# Patient Record
Sex: Male | Born: 1955 | Race: Black or African American | Hispanic: No | Marital: Married | Smoking: Current every day smoker
Health system: Southern US, Community
[De-identification: ages and names within clinical notes are randomized; demographics above are authoritative.]

## PROBLEM LIST (undated history)

## (undated) DIAGNOSIS — I10 Essential (primary) hypertension: Secondary | ICD-10-CM

## (undated) DIAGNOSIS — E785 Hyperlipidemia, unspecified: Secondary | ICD-10-CM

## (undated) DIAGNOSIS — R0602 Shortness of breath: Secondary | ICD-10-CM

## (undated) HISTORY — DX: Hyperlipidemia, unspecified: E78.5

## (undated) HISTORY — DX: Shortness of breath: R06.02

## (undated) HISTORY — DX: Essential (primary) hypertension: I10

---

## 1997-09-24 ENCOUNTER — Emergency Department (HOSPITAL_COMMUNITY): Admission: EM | Admit: 1997-09-24 | Discharge: 1997-09-24 | Payer: Self-pay | Admitting: Emergency Medicine

## 2005-12-31 ENCOUNTER — Ambulatory Visit: Payer: Self-pay | Admitting: Family Medicine

## 2006-01-21 ENCOUNTER — Ambulatory Visit: Payer: Self-pay | Admitting: Family Medicine

## 2006-01-21 LAB — CONVERTED CEMR LAB: Glucose, Bld: 132 mg/dL — ABNORMAL HIGH (ref 70–99)

## 2006-02-12 ENCOUNTER — Ambulatory Visit: Payer: Self-pay | Admitting: Family Medicine

## 2006-02-12 LAB — CONVERTED CEMR LAB
ALT: 21 units/L (ref 0–40)
AST: 27 units/L (ref 0–37)
BUN: 10 mg/dL (ref 6–23)
Glomerular Filtration Rate, Af Am: 102 mL/min/{1.73_m2}
Potassium: 3.8 meq/L (ref 3.5–5.1)

## 2006-04-06 ENCOUNTER — Ambulatory Visit: Payer: Self-pay | Admitting: Family Medicine

## 2006-04-06 LAB — CONVERTED CEMR LAB
ALT: 28 units/L (ref 0–40)
AST: 23 units/L (ref 0–37)
BUN: 11 mg/dL (ref 6–23)
CO2: 26 meq/L (ref 19–32)
Calcium: 9.1 mg/dL (ref 8.4–10.5)
Chloride: 105 meq/L (ref 96–112)
Creatinine, Ser: 1.1 mg/dL (ref 0.4–1.5)
Microalb Creat Ratio: 3 mg/g (ref 0.0–30.0)
Microalb, Ur: 0.6 mg/dL (ref 0.0–1.9)

## 2006-07-02 DIAGNOSIS — E785 Hyperlipidemia, unspecified: Secondary | ICD-10-CM | POA: Insufficient documentation

## 2006-07-02 DIAGNOSIS — E118 Type 2 diabetes mellitus with unspecified complications: Secondary | ICD-10-CM | POA: Insufficient documentation

## 2006-07-02 DIAGNOSIS — E119 Type 2 diabetes mellitus without complications: Secondary | ICD-10-CM

## 2006-07-06 ENCOUNTER — Ambulatory Visit: Payer: Self-pay | Admitting: Family Medicine

## 2006-07-06 LAB — CONVERTED CEMR LAB
ALT: 33 units/L (ref 0–40)
AST: 21 units/L (ref 0–37)
BUN: 11 mg/dL (ref 6–23)
CO2: 30 meq/L (ref 19–32)
Calcium: 9.1 mg/dL (ref 8.4–10.5)
Creatinine, Ser: 0.9 mg/dL (ref 0.4–1.5)
GFR calc Af Amer: 114 mL/min
Total CHOL/HDL Ratio: 3.9

## 2006-08-09 ENCOUNTER — Ambulatory Visit: Payer: Self-pay | Admitting: Family Medicine

## 2006-08-09 ENCOUNTER — Encounter: Payer: Self-pay | Admitting: Family Medicine

## 2006-08-09 ENCOUNTER — Encounter (INDEPENDENT_AMBULATORY_CARE_PROVIDER_SITE_OTHER): Payer: Self-pay | Admitting: Family Medicine

## 2006-08-09 DIAGNOSIS — I10 Essential (primary) hypertension: Secondary | ICD-10-CM

## 2006-08-09 LAB — CONVERTED CEMR LAB
Chloride: 111 meq/L (ref 96–112)
GFR calc non Af Amer: 108 mL/min
Potassium: 3.7 meq/L (ref 3.5–5.1)
Sodium: 143 meq/L (ref 135–145)

## 2006-08-10 ENCOUNTER — Telehealth (INDEPENDENT_AMBULATORY_CARE_PROVIDER_SITE_OTHER): Payer: Self-pay | Admitting: *Deleted

## 2006-09-01 ENCOUNTER — Ambulatory Visit: Payer: Self-pay | Admitting: Internal Medicine

## 2006-09-01 ENCOUNTER — Encounter (INDEPENDENT_AMBULATORY_CARE_PROVIDER_SITE_OTHER): Payer: Self-pay | Admitting: Family Medicine

## 2006-09-01 ENCOUNTER — Telehealth (INDEPENDENT_AMBULATORY_CARE_PROVIDER_SITE_OTHER): Payer: Self-pay | Admitting: *Deleted

## 2006-09-01 LAB — CONVERTED CEMR LAB
BUN: 14 mg/dL (ref 6–23)
Creatinine, Ser: 1 mg/dL (ref 0.4–1.5)
GFR calc Af Amer: 101 mL/min
GFR calc non Af Amer: 84 mL/min
Potassium: 3.9 meq/L (ref 3.5–5.1)

## 2006-11-17 ENCOUNTER — Ambulatory Visit: Payer: Self-pay | Admitting: Family Medicine

## 2006-11-18 ENCOUNTER — Telehealth (INDEPENDENT_AMBULATORY_CARE_PROVIDER_SITE_OTHER): Payer: Self-pay | Admitting: *Deleted

## 2006-11-18 LAB — CONVERTED CEMR LAB
ALT: 23 U/L
AST: 22 U/L
Cholesterol: 129 mg/dL
Glucose, Bld: 86 mg/dL
HDL: 48.1 mg/dL
Hgb A1c MFr Bld: 6.5 % — ABNORMAL HIGH
LDL Cholesterol: 57 mg/dL
Total CHOL/HDL Ratio: 2.7
Triglycerides: 122 mg/dL
VLDL: 24 mg/dL

## 2007-02-17 ENCOUNTER — Ambulatory Visit: Payer: Self-pay | Admitting: Family Medicine

## 2007-02-23 LAB — CONVERTED CEMR LAB
BUN: 12 mg/dL (ref 6–23)
Calcium: 9.3 mg/dL (ref 8.4–10.5)
Chloride: 108 meq/L (ref 96–112)
GFR calc non Af Amer: 95 mL/min
Hgb A1c MFr Bld: 6.8 % — ABNORMAL HIGH (ref 4.6–6.0)

## 2007-04-12 ENCOUNTER — Telehealth (INDEPENDENT_AMBULATORY_CARE_PROVIDER_SITE_OTHER): Payer: Self-pay | Admitting: *Deleted

## 2007-11-21 ENCOUNTER — Telehealth (INDEPENDENT_AMBULATORY_CARE_PROVIDER_SITE_OTHER): Payer: Self-pay | Admitting: *Deleted

## 2007-11-22 ENCOUNTER — Ambulatory Visit: Payer: Self-pay | Admitting: Internal Medicine

## 2007-11-22 DIAGNOSIS — F17200 Nicotine dependence, unspecified, uncomplicated: Secondary | ICD-10-CM

## 2007-11-23 ENCOUNTER — Ambulatory Visit: Payer: Self-pay | Admitting: Internal Medicine

## 2007-11-28 LAB — CONVERTED CEMR LAB
AST: 22 units/L (ref 0–37)
HDL: 47.8 mg/dL (ref >39.0)
Hgb A1c MFr Bld: 6.9 % — ABNORMAL HIGH (ref 4.6–6.0)
Microalb Creat Ratio: 5.7 mg/g (ref 0.0–30.0)
Triglycerides: 102 mg/dL (ref 0–149)

## 2008-12-04 ENCOUNTER — Telehealth (INDEPENDENT_AMBULATORY_CARE_PROVIDER_SITE_OTHER): Payer: Self-pay | Admitting: *Deleted

## 2009-04-19 ENCOUNTER — Telehealth: Payer: Self-pay | Admitting: Internal Medicine

## 2009-07-29 ENCOUNTER — Telehealth (INDEPENDENT_AMBULATORY_CARE_PROVIDER_SITE_OTHER): Payer: Self-pay | Admitting: *Deleted

## 2009-08-30 ENCOUNTER — Ambulatory Visit: Payer: Self-pay | Admitting: Internal Medicine

## 2009-09-09 ENCOUNTER — Telehealth: Payer: Self-pay | Admitting: Internal Medicine

## 2009-09-10 ENCOUNTER — Telehealth (INDEPENDENT_AMBULATORY_CARE_PROVIDER_SITE_OTHER): Payer: Self-pay | Admitting: *Deleted

## 2009-09-10 LAB — CONVERTED CEMR LAB
ALT: 16 units/L (ref 0–53)
Basophils Relative: 1 % (ref 0–1)
CO2: 25 meq/L (ref 19–32)
Calcium: 9 mg/dL (ref 8.4–10.5)
Creatinine, Ser: 0.92 mg/dL (ref 0.40–1.50)
Hgb A1c MFr Bld: 7 % — ABNORMAL HIGH (ref <5.7)
Lymphs Abs: 2.5 10*3/uL (ref 0.7–4.0)
MCHC: 31.9 g/dL (ref 30.0–36.0)
Monocytes Relative: 12 % (ref 3–12)
Neutro Abs: 5.5 10*3/uL (ref 1.7–7.7)
Neutrophils Relative %: 59 % (ref 43–77)
Platelets: 232 10*3/uL (ref 150–400)
RBC: 6.04 M/uL — ABNORMAL HIGH (ref 4.22–5.81)
Sodium: 141 meq/L (ref 135–145)
TSH: 2.036 microintl units/mL (ref 0.350–4.500)
WBC: 9.2 10*3/uL (ref 4.0–10.5)

## 2010-01-21 ENCOUNTER — Telehealth (INDEPENDENT_AMBULATORY_CARE_PROVIDER_SITE_OTHER): Payer: Self-pay | Admitting: *Deleted

## 2010-01-31 ENCOUNTER — Encounter (INDEPENDENT_AMBULATORY_CARE_PROVIDER_SITE_OTHER): Payer: Self-pay | Admitting: *Deleted

## 2010-02-24 ENCOUNTER — Ambulatory Visit: Payer: Self-pay | Admitting: Internal Medicine

## 2010-02-24 DIAGNOSIS — D649 Anemia, unspecified: Secondary | ICD-10-CM

## 2010-02-28 ENCOUNTER — Ambulatory Visit: Payer: Self-pay | Admitting: Internal Medicine

## 2010-03-03 LAB — CONVERTED CEMR LAB
Ferritin: 153.2 ng/mL (ref 22.0–322.0)
Hemoglobin: 13.2 g/dL (ref 13.0–17.0)
Hgb A1c MFr Bld: 7.2 % — ABNORMAL HIGH (ref 4.6–6.5)
Iron: 43 ug/dL (ref 42–165)
LDL Cholesterol: 84 mg/dL (ref 0–99)
Total CHOL/HDL Ratio: 3
Triglycerides: 80 mg/dL (ref 0.0–149.0)

## 2010-03-07 ENCOUNTER — Encounter (INDEPENDENT_AMBULATORY_CARE_PROVIDER_SITE_OTHER): Payer: Self-pay | Admitting: *Deleted

## 2010-04-29 NOTE — Progress Notes (Signed)
Summary: no RF w/o OV  Phone Note Outgoing Call Call back at [facility] Phone [phone] Call back at Work Phone [phone]   Summary of Call: LAST OV 2009, NEEDS TO SCHEDULE OV BEFORE I CAN REFILL MED Shary Decamp  Jul 29, 2009 2:10 PM   Follow-up for Phone Call        Patient stated that work is very slow right now and he can not afford to come in. I offered him billings number to set up a payment plan and he refused because he said he was to far behind on his bills already. I said he can barely afford his medications as it is.  Follow-up by: Harold Barban,  Jul 30, 2009 9:42 AM  Additional Follow-up for Phone Call Additional follow up Details #1::        Dr Drue Novel see phone note from 03/2009.  Do you want to keep rxing meds?? Shary Decamp  Jul 30, 2009 10:54 AM     Additional Follow-up for Phone Call Additional follow up Details #2::    I ike to help but i can harm him by Rx meds w/o supervision. No RF w/o OV. Give patient the # of  [facility] [place] [facility]. Paz MD  Jul 30, 2009 1:47 PM   Pt has been informed of Dr Drue Novel recommendations, [facility] numbers were  given for Marsa Aris  and [facility] .Kandice Hams  Jul 30, 2009 2:17 PM  Follow-up by: Kandice Hams,  Jul 30, 2009 2:17 PM

## 2010-04-29 NOTE — Progress Notes (Signed)
Summary: FYI-Lab Work, unable to add iron d/t cost  Phone Note Outgoing Call   Call placed by: Harold Barban,  September 09, 2009 10:33 AM Call placed to: Patient Summary of Call: Spoke with patient about coming in to draw a iron-ferritin per your request and the patient says he cannot afford to do that right now. He has no insurance and is paying out of pocket. Initial call taken by: Harold Barban,  September 09, 2009 10:34 AM  Follow-up for Phone Call        noted [place] E. Dianna Ewald MD  September 09, 2009 12:39 PM

## 2010-04-29 NOTE — Assessment & Plan Note (Signed)
Summary: cpx/cbs   Vital Signs:  Patient profile:   55 year old male Height:      66 inches Weight:      178 pounds BMI:     28.83 Temp:     98.3 degrees F oral Pulse rate:   82 / minute Resp:     18 per minute BP sitting:   140 / 78  (left arm)  Vitals Entered By: Jeremy Johann CMA (August 30, 2009 2:41 PM) CC: cpx Comments --not fasting --refills REVIEWED MED LIST, PATIENT AGREED DOSE AND INSTRUCTION CORRECT    History of Present Illness: CPX   Diabetes-- good medication compliance  Hyperlipidemia-- good medication compliance  HTN-- run out of meds , no ambulatory BPs   Allergies (verified): No Known Drug Allergies  Past History:  Past Medical History: Reviewed history from 11/22/2007 and no changes required. Diabetes mellitus, type II Hyperlipidemia HTN  Past Surgical History: no major surgery   Family History: Reviewed history from 11/22/2007 and no changes required. CAD - no HTN - M DM - M stroke - no colon Ca - no prostate Ca - no alzheimer's - M  father's family hx - unknown  Social History: Occupation: Recruitment consultant Married with 2 children Current Smoker, 1 ppd  Alcohol use-no Drug use-no diet-- try to eat well but still eats carbs-sweats exercise-- active at work  Review of Systems General:  Denies fever and weight loss. CV:  Denies chest pain or discomfort; occ LE edema whenever he eats beef or dairy products. Resp:  Denies cough, shortness of breath, and wheezing. GI:  Denies bloody stools, diarrhea, nausea, and vomiting. GU:  Denies hematuria, urinary frequency, and urinary hesitancy. Endo:  no ambulatory CBGs .  Physical Exam  General:  alert, well-developed, and well-nourished.   Neck:  no masses and no thyromegaly.   Lungs:  normal respiratory effort, no intercostal retractions, no accessory muscle use, and normal breath sounds.   Heart:  normal rate, regular rhythm, no murmur, and no gallop.   Abdomen:  soft, non-tender,  no distention, no masses, no guarding, and no rigidity.   Rectal:  No external abnormalities noted. Normal sphincter tone. No rectal masses or tenderness. Hemoccult negative Prostate:  Prostate gland firm and smooth, no enlargement, nodularity, tenderness, mass, asymmetry or induration. Extremities:  no lower extremity edema  Diabetes Management Exam:    Foot Exam (with socks and/or shoes not present):       Sensory-Pinprick/Light touch:          Left medial foot (L-4): normal          Left dorsal foot (L-5): normal          Left lateral foot (S-1): normal          Right medial foot (L-4): normal          Right dorsal foot (L-5): normal          Right lateral foot (S-1): normal       Sensory-Monofilament:          Left foot: normal          Right foot: normal       Inspection:          Left foot: normal          Right foot: normal       Nails:          Left foot: normal  Right foot: normal   Impression & Recommendations:  Problem # 1:  ROUTINE GENERAL MEDICAL EXAM@HEALTH  CARE FACL (ICD-V70.0)  Td 07 Discussed diet and exercise Counseled  about tobacco, aware of  "quitting programs"  at the hospital labs  Colonoscopy Vs.iFOB cards reviewed w/ pt. Provided  iFOB but he will  call if he decides to have a  colonoscopy   Orders: Venipuncture ([address])  Problem # 2:  HYPERTENSION, BENIGN ESSENTIAL (ICD-401.1) runned out of meds x  3 days, BP okay labs, samples provided His updated medication list for this problem includes:    Benicar 20 Mg Tabs (Olmesartan medoxomil) .Marland Kitchen... 1 by mouth once daily - due office visit  BP today: 140/78 Prior BP: 148/80 (11/22/2007)  Labs Reviewed: K+: 3.8 (02/17/2007) Creat: : 0.9 (02/17/2007)   Chol: 138 (11/23/2007)   HDL: 47.8 (11/23/2007)   LDL: 70 (11/23/2007)   TG: 102 (11/23/2007)  Problem # 3:  HYPERLIPIDEMIA (ICD-272.4) check LFTs, not fasting, FLP on RTC  His updated medication list for this problem includes:    Zocor 40 Mg  Tabs (Simvastatin) .Marland Kitchen... Take 1 tablet by mouth once a day  Labs Reviewed: SGOT: 22 (11/23/2007)   SGPT: 32 (11/23/2007)   HDL:47.8 (11/23/2007), 48.1 (11/17/2006)  LDL:70 (11/23/2007), 57 (11/17/2006)  Chol:138 (11/23/2007), 129 (11/17/2006)  Trig:102 (11/23/2007), 122 (11/17/2006)  Problem # 4:  DIABETES MELLITUS, TYPE II (ICD-250.00) no ambulatory CBGs, labs recommend routine eye check ups His updated medication list for this problem includes:    Benicar 20 Mg Tabs (Olmesartan medoxomil) .Marland Kitchen... 1 by mouth once daily - due office visit    Metformin Hcl 1000 Mg Tabs (Metformin hcl) .Marland Kitchen... 2 by mouth once daily  Labs Reviewed: Creat: 0.9 (02/17/2007)    Reviewed HgBA1c results: 6.9 (11/23/2007)  6.8 (02/17/2007)  Complete Medication List: 1)  Zocor 40 Mg Tabs (Simvastatin) .... Take 1 tablet by mouth once a day 2)  Benicar 20 Mg Tabs (Olmesartan medoxomil) .Marland Kitchen.. 1 by mouth once daily - due office visit 3)  Metformin Hcl 1000 Mg Tabs (Metformin hcl) .... 2 by mouth once daily  Patient Instructions: 1)  Please schedule a follow-up appointment in 4 months .  Prescriptions: METFORMIN HCL 1000 MG  TABS (METFORMIN HCL) 2 by mouth once daily  #60 x 6   Entered and Authorized by:   Elita Quick E. Maydell Knoebel MD   Signed by:   Nolon Rod. Janey Petron MD on 08/30/2009   Method used:   Electronically to        [id number]* (retail)       [address] [place], [place]  [address]       Ph: [phone]       Fax: [phone]   RxID:   [id number] BENICAR 20 MG TABS (OLMESARTAN MEDOXOMIL) 1 by mouth once daily - DUE OFFICE VISIT  #30 x 6   Entered and Authorized by:   Nolon Rod. Hampton Cost MD   Signed by:   Nolon Rod. Caitlin Ainley MD on 08/30/2009   Method used:   Electronically to        [facility] #[id number]* (retail)       [address] [place], [place]  [address]       Ph: [phone]       Fax: [phone]   RxID:   [id number] ZOCOR 40 MG TABS (SIMVASTATIN) Take 1 tablet by mouth once  a  day  #30 x 6   Entered and Authorized by:   Nolon Rod. Aylssa Herrig MD   Signed by:   Nolon Rod. Addaline Peplinski MD on 08/30/2009   Method used:   Electronically to        [facility] #[id number]* (retail)       [address] [place], [place]  [address]       Ph: [phone]       Fax: [phone]   RxID:   [id number]

## 2010-04-29 NOTE — Letter (Signed)
Summary: Primary Care Appointment Letter  [facility] [place]  [address] [place], [place] [address]   Phone: [phone]  Fax: [phone]    01/31/2010 MRN: [id number]  Tristan Figueroa [address] [place], [place]  [address]  Dear Mr. [name],   Your Primary Care Physician [place] E. Paz MD has indicated that:    _XXX______it is time to schedule an appointment for "office visit and              labs"    _______you missed your appointment on______ and need to call and          reschedule.    _______you need to have lab work done.    _______you need to schedule an appointment discuss lab or test results.    _______you need to call to reschedule your appointment that is                       scheduled on _________.     Please call our office as soon as possible. Our phone number is [phone]. Our office is open 8a-12noon and 1p-5p, Monday through Friday.     Thank you,     Sarah at extension [phone]    [facility]

## 2010-04-29 NOTE — Progress Notes (Signed)
Summary: due  for office visit-labs  lm 10/25--mailed letter 11-04  Phone Note Outgoing Call   Summary of Call: please call or send a letter. He is due for a office visit and labs  [place] E. Paz MD  January 21, 2010 10:34 AM   Follow-up for Phone Call        Home number is d/c or not in service. Lm with Diplomatic Services operational officer at work to call back to office. Lucious Groves CMA  January 21, 2010 3:02 PM

## 2010-04-29 NOTE — Assessment & Plan Note (Signed)
Summary: rto 6 months.cbs   Vital Signs:  Patient profile:   55 year old male Weight:      177.38 pounds Pulse rate:   91 / minute Pulse rhythm:   regular BP sitting:   124 / 82  (left arm) Cuff size:   regular  Vitals Entered By: Army Fossa CMA (February 24, 2010 10:41 AM) CC: 6 month f/u- had a coffee w/ cream and sugar Comments States when eating dairy products feet swell. [facility] [address] Declines flu shot    History of Present Illness: routine office visit  Diabetes-- no ambulatory CBGs , diet ok , trying to eat healthy ; does  like rice  Hyperlipidemia-- good medication compliance  HTN-- no ambulatory BPs , good medication compliance  found to have mild anemia, we were  unable to add iron to his labs. IFOB not done   dairy products cause R ankle swelling    ROS denies pruritus , bloating, N-V-D w/ dairy products  no myalgias , no major aches and pains  no blood in stools no abd. pain    Current Medications (verified): 1)  Zocor 40 Mg Tabs (Simvastatin) .... Take 1 Tablet By Mouth Once A Day 2)  Benicar 20 Mg Tabs (Olmesartan Medoxomil) .Marland Kitchen.. 1 By Mouth Once Daily - Due Office Visit 3)  Metformin Hcl 1000 Mg  Tabs (Metformin Hcl) .... 2 By Mouth Once Daily  Allergies (verified): No Known Drug Allergies  Past History:  Past Medical History: Reviewed history from 11/22/2007 and no changes required. Diabetes mellitus, type II Hyperlipidemia HTN  Past Surgical History: Reviewed history from 08/30/2009 and no changes required. no major surgery   Physical Exam  General:  alert and well-developed.   Lungs:  normal respiratory effort, no intercostal retractions, no accessory muscle use, and normal breath sounds.   Heart:  normal rate, regular rhythm, and no murmur.   Extremities:  no lower extremity edema   Impression & Recommendations:  Problem # 1:  ANEMIA (ICD-285.9) last hemoglobin slightly low Never had a colonoscopy, DRE and hemocult (-)  6-11 cost of care is an  issue. plan: Labs If iron deficient, will need GI workup  Problem # 2:  ? of LACTOSE INTOLERANCE (ICD-271.3) right ankle edema with dairy products etiology unclear  Problem # 3:  HYPERTENSION, BENIGN ESSENTIAL (ICD-401.1) at goal  His updated medication list for this problem includes:    Benicar 20 Mg Tabs (Olmesartan medoxomil) .Marland Kitchen... 1 by mouth once daily - due office visit  BP today: 124/82 Prior BP: 140/78 (08/30/2009)  Labs Reviewed: K+: 3.8 (08/30/2009) Creat: : 0.92 (08/30/2009)   Chol: 138 (11/23/2007)   HDL: 47.8 (11/23/2007)   LDL: 70 (11/23/2007)   TG: 102 (11/23/2007)  Problem # 4:  HYPERLIPIDEMIA (ICD-272.4) due for labs His updated medication list for this problem includes:    Zocor 40 Mg Tabs (Simvastatin) .Marland Kitchen... Take 1 tablet by mouth once a day  Labs Reviewed: SGOT: 16 (08/30/2009)   SGPT: 16 (08/30/2009)   HDL:47.8 (11/23/2007), 48.1 (11/17/2006)  LDL:70 (11/23/2007), 57 (11/17/2006)  Chol:138 (11/23/2007), 129 (11/17/2006)  Trig:102 (11/23/2007), 122 (11/17/2006)  Problem # 5:  DIABETES MELLITUS, TYPE II (ICD-250.00) labs Encouraged a  healthy lifestyle His updated medication list for this problem includes:    Benicar 20 Mg Tabs (Olmesartan medoxomil) .Marland Kitchen... 1 by mouth once daily - due office visit    Metformin Hcl 1000 Mg Tabs (Metformin hcl) .Marland Kitchen... 2 by mouth once daily  Labs Reviewed: Creat:  0.92 (08/30/2009)    Reviewed HgBA1c results: 7.0 (08/30/2009)  6.9 (11/23/2007)  Complete Medication List: 1)  Zocor 40 Mg Tabs (Simvastatin) .... Take 1 tablet by mouth once a day 2)  Benicar 20 Mg Tabs (Olmesartan medoxomil) .Marland Kitchen.. 1 by mouth once daily - due office visit 3)  Metformin Hcl 1000 Mg Tabs (Metformin hcl) .... 2 by mouth once daily  Patient Instructions: 1)  come back fasting 2)  FLP ----dx hyperlipidemia 3)  Hemoglobin A1c----- dx  diabetes 4)  Hemoglobin, iron, ferritin---- dx  anemia 5)  Please schedule a follow-up  appointment in 6 months .    Orders Added: 1)  Est. Patient Level III [78469]   Immunization History:  Influenza Immunization History:    Influenza:  strongly declined  (02/24/2010)   Immunization History:  Influenza Immunization History:    Influenza:  strongly declined  (02/24/2010)

## 2010-04-29 NOTE — Progress Notes (Signed)
Summary: UNABLE TO REACH LABS MAILED  Phone Note Outgoing Call Call back at [phone]   Call placed by: [facility] CMA,  September 10, 2009 11:11 AM Details for Reason: advised patient DM remains well controlled He has mild anemia, will have to recheck when he comes back ( was unable to get his iron checked).  all other labs within normal  remind him to return in the iFOB ( stool test )  Summary of Call: left message to call office.................Marland KitchenFelecia Deloach CMA  September 10, 2009 11:11 AM  TRIED TO CALL PT # DISCONNECTED WILL TRY AGAIN LATER...........Marland KitchenFelecia Deloach CMA  September 11, 2009 10:24 AM  tried to call pt # still disconnect will try again tomorrow....................Marland KitchenFelecia Deloach CMA  September 12, 2009 2:36 PM  tried to call several times #still disconnected labs mailed to pt.................Marland KitchenFelecia Deloach CMA  September 13, 2009 11:25 AM

## 2010-04-29 NOTE — Progress Notes (Signed)
Summary: needs OV   Phone Note Call from Patient Call back at [phone]   Summary of Call: Patient has a cpx scheduled for 04/26/09.  Patient states that he is not going to be able to pay for his visit because he is currently not working & does not have insurance.  Patient wants to know if MD will refill meds without ov?  Last ov here was 10/2007 Shary Decamp  April 19, 2009 11:59 AM   Follow-up for Phone Call        unfortunately i won't  be able to prescribe his medications long-term. call a two-month supply of medicines samples of Benicar enough for two months recommended patient to get stablish @ [facility]  or the health department Follow-up by: Nolon Rod. Jayin Derousse MD,  April 19, 2009 2:58 PM  Additional Follow-up for Phone Call Additional follow up Details #1::        DISCUSSED WITH PT, SAMPLES GIVEN TO PT Additional Follow-up by: Shary Decamp,  April 22, 2009 11:57 AM    Prescriptions: METFORMIN HCL 1000 MG  TABS (METFORMIN HCL) 2 by mouth once daily  #60.0 Each x 1   Entered by:   Shary Decamp   Authorized by:   Nolon Rod. Gerldine Suleiman MD   Signed by:   Shary Decamp on 04/22/2009   Method used:   Electronically to        [id number]* (retail)       [address] [place], [place]  [address]       Ph: [phone]       Fax: [phone]   RxID:   [id number] ZOCOR 40 MG TABS (SIMVASTATIN) Take 1 tablet by mouth once a day  #30.0 Each x 1   Entered by:   Shary Decamp   Authorized by:   Nolon Rod. Mackie Holness MD   Signed by:   Shary Decamp on 04/22/2009   Method used:   Electronically to        [id number]* (retail)       [address] [place], [place]  [address]       Ph: [phone]       Fax: [phone]   RxID:   [id number]

## 2010-04-29 NOTE — Letter (Signed)
Summary: Unable To Reach-Consult Scheduled  [facility] [place]  [address] [place], [place] [address]   Phone: [phone]  Fax: [phone]    03/07/2010 MRN: [id number]    Dear [name],   We have been unable to reach you by phone.  Please contact our office with an updated phone number.    Thank you,  Army Fossa CMA  March 07, 2010 3:49 PM

## 2010-06-02 ENCOUNTER — Encounter (INDEPENDENT_AMBULATORY_CARE_PROVIDER_SITE_OTHER): Payer: Self-pay | Admitting: *Deleted

## 2010-06-02 ENCOUNTER — Telehealth (INDEPENDENT_AMBULATORY_CARE_PROVIDER_SITE_OTHER): Payer: Self-pay | Admitting: *Deleted

## 2010-06-10 NOTE — Letter (Signed)
Summary: Primary Care Appointment Letter  [facility] [place]  [address] [place], [place] [address]   Phone: [phone]  Fax: [phone]    06/02/2010 MRN: [id number]  Tristan Figueroa [address] [place], [place]  [address]  Dear Mr. [name],   Your Primary Care Physician [place] E. Paz MD has indicated that:    _______it is time to schedule an appointment.    _______you missed your appointment on______ and need to call and          reschedule.    ___X____you need to have lab work done.    _______you need to schedule an appointment discuss lab or test results.    _______you need to call to reschedule your appointment that is                       scheduled on _________.     Please call our office as soon as possible. Our phone number is [phone]. Please press option 1. Our office is open 8a-12noon and 1p-5p, Monday through Friday.     Thank you,    [facility]

## 2010-06-10 NOTE — Progress Notes (Signed)
----   Converted from flag ---- ---- 03/01/2010 11:57 AM, Elita Quick E. Paz MD wrote: needs A1c ------------------------------  number disconnected-- will mail pt a letter.

## 2010-10-23 ENCOUNTER — Other Ambulatory Visit: Payer: Self-pay | Admitting: Internal Medicine

## 2011-01-07 ENCOUNTER — Ambulatory Visit (INDEPENDENT_AMBULATORY_CARE_PROVIDER_SITE_OTHER): Payer: Self-pay | Admitting: Internal Medicine

## 2011-01-07 ENCOUNTER — Encounter: Payer: Self-pay | Admitting: Internal Medicine

## 2011-01-07 DIAGNOSIS — I10 Essential (primary) hypertension: Secondary | ICD-10-CM

## 2011-01-07 DIAGNOSIS — F17200 Nicotine dependence, unspecified, uncomplicated: Secondary | ICD-10-CM

## 2011-01-07 DIAGNOSIS — E119 Type 2 diabetes mellitus without complications: Secondary | ICD-10-CM

## 2011-01-07 DIAGNOSIS — Z136 Encounter for screening for cardiovascular disorders: Secondary | ICD-10-CM

## 2011-01-07 DIAGNOSIS — Z0000 Encounter for general adult medical examination without abnormal findings: Secondary | ICD-10-CM

## 2011-01-07 DIAGNOSIS — E785 Hyperlipidemia, unspecified: Secondary | ICD-10-CM

## 2011-01-07 LAB — TSH: TSH: 1.04 u[IU]/mL (ref 0.35–5.50)

## 2011-01-07 LAB — IRON: Iron: 42 ug/dL (ref 42–165)

## 2011-01-07 LAB — COMPREHENSIVE METABOLIC PANEL
ALT: 18 U/L (ref 0–53)
CO2: 27 mEq/L (ref 19–32)
Calcium: 9.1 mg/dL (ref 8.4–10.5)
Chloride: 107 mEq/L (ref 96–112)
GFR: 112.45 mL/min (ref >60.00)
Sodium: 141 mEq/L (ref 135–145)
Total Protein: 7.3 g/dL (ref 6.0–8.3)

## 2011-01-07 LAB — MICROALBUMIN / CREATININE URINE RATIO: Microalb Creat Ratio: 1.1 mg/g (ref 0.0–30.0)

## 2011-01-07 LAB — LIPID PANEL: Total CHOL/HDL Ratio: 4

## 2011-01-07 MED ORDER — LOSARTAN POTASSIUM 25 MG PO TABS: 25.0000 mg | ORAL_TABLET | Freq: Every day | ORAL | Status: DC

## 2011-01-07 NOTE — Assessment & Plan Note (Signed)
Risk reviewed, counseled about quitting

## 2011-01-07 NOTE — Assessment & Plan Note (Signed)
Based on the last hemoglobin A1c he was recommended amaryl  but never started. Labs Feet exam normal Eye care and feet are discussed.

## 2011-01-07 NOTE — Assessment & Plan Note (Addendum)
Td 07 ekg nsr  Declined a  flu shot, explained the benefits Discussed diet and exercise Counseled  about tobacco, aware of  "quitting programs"  at the hospital labs including a CBC, vitamin D and iron as his Hg was slt low in the past Colonoscopy Vs.iFOB cards reviewed w/ pt. Provided  iFOB but he will  call if he decides to have a  colonoscopy  Diet-exercise discussed   start ASA , benefits discussed

## 2011-01-07 NOTE — Assessment & Plan Note (Addendum)
Patient was supposed to be on Zocor 40 mg but self decreased to 20 mg because he felt that Zocor was affecting his immune  system, "I was having a lot of colds". I discussed the issue with the patient, I doubt frequent colds are related to Zocor. Plan: D/C Zocor Labs Lipitor?

## 2011-01-07 NOTE — Assessment & Plan Note (Addendum)
He was taking Benicar 20 mg but not the lower extremity edema, he self decrease Benicar to half tablet every of the day. BP okay today. I discussed the issue with the patient, doubt edema related to ARBs Plan:  D/C Benicar losartan, low dose mostly for kidney protection  Labs

## 2011-01-07 NOTE — Progress Notes (Signed)
  Subjective:    Patient ID: Tristan Figueroa, male    DOB: 06-05-1955, 55 y.o.   MRN: [id number]  HPI Complete physical exam, likes his vitamin D check, wonders if he can take fish oil (yes) Diabetes--base on  last hemoglobin A1c, he was recommended amaryl but never started. Hypertension--has decreased dose of Benicar d/t s/e (?) , see assessment and plan Hyperlipidemia--can't decrease Zocor to a half tablet daily d/t s/e (?), see assessment and plan  Past Medical History  Diagnosis Date  . Hypertension   . Hyperlipidemia   . Diabetes mellitus     type 2   Past Surgical History  Procedure Date  . No past surgeries    History   Social History  . Marital Status: Married    Spouse Name: N/A    Number of Children: 2  . Years of Education: N/A   Occupational History  . INSTALLER    Social History Main Topics  . Smoking status: Current Everyday Smoker -- 1 years    Types: Cigarettes  . Smokeless tobacco: Never Used  . Alcohol Use: No  . Drug Use: No  . Sexually Active: Not on file   Other Topics Concern  . Not on file   Social History Narrative   Active at work, no routine exercise ---diet: tries to eat well-healthy   Family History  Problem Relation Age of Onset  . Hypertension Mother   . Diabetes Mother   . Dementia Mother   . Coronary artery disease Neg Hx   . Colon cancer Neg Hx   . Prostate cancer Neg Hx      Review of Systems  Constitutional: Negative for fever and fatigue.  Respiratory: Negative for cough, shortness of breath and wheezing.   Cardiovascular: Negative for chest pain and palpitations.  Gastrointestinal: Negative for abdominal pain and blood in stool.  Genitourinary: Negative for dysuria, hematuria and difficulty urinating.  Psychiatric/Behavioral:       No anxiety-depression       Objective:   Physical Exam  Constitutional: He is oriented to person, place, and time. He appears well-developed and well-nourished. No distress.  HENT:    Head: Normocephalic and atraumatic.  Neck: No thyromegaly present.  Cardiovascular: Normal rate, regular rhythm and normal heart sounds.   No murmur heard. Pulmonary/Chest: Effort normal and breath sounds normal. No respiratory distress. He has no wheezes. He has no rales.  Abdominal: Soft. Bowel sounds are normal. He exhibits no distension. There is no tenderness. There is no rebound and no guarding.  Genitourinary: Rectum normal and prostate normal. Guaiac negative stool.  Musculoskeletal:       DIABETIC FEET EXAM: No lower extremity edema Normal pedal pulses bilaterally Skin and nails are normal without calluses Pinprick examination of the feet normal.   Neurological: He is alert and oriented to person, place, and time.  Skin: Skin is warm and dry. He is not diaphoretic.  Psychiatric: He has a normal mood and affect. His behavior is normal. Judgment and thought content normal.          Assessment & Plan:

## 2011-01-09 ENCOUNTER — Encounter: Payer: Self-pay | Admitting: *Deleted

## 2011-01-12 ENCOUNTER — Telehealth: Payer: Self-pay | Admitting: *Deleted

## 2011-01-12 MED ORDER — ATORVASTATIN CALCIUM 10 MG PO TABS: 10.0000 mg | ORAL_TABLET | Freq: Every day | ORAL | Status: DC

## 2011-01-12 MED ORDER — GLIMEPIRIDE 2 MG PO TABS: 2.0000 mg | ORAL_TABLET | ORAL | Status: DC

## 2011-01-12 MED ORDER — ERGOCALCIFEROL 1.25 MG (50000 UT) PO CAPS: 50000.0000 [IU] | ORAL_CAPSULE | ORAL | Status: DC

## 2011-01-12 NOTE — Telephone Encounter (Signed)
Left msg on voicemail to return call.

## 2011-01-12 NOTE — Telephone Encounter (Signed)
Message copied by Doristine Devoid on Mon Jan 12, 2011 10:42 AM ------      Message from: Willow Ora E      Created: Thu Jan 08, 2011 12:27 PM       Advise patient, vitamin D is low, cholesterol is elevated and diabetes needs better treatment.      Plan:      Start Amaryl 2 mg one by mouth daily      Start Lipitor 10 mg one by mouth daily       ergocalciferol 50,000 units one tablet weekly for 3 months, call #12 tablets , no refills      Also I forgot to enter a CBC, please add it on. Dx v70      RTC 3 months

## 2011-01-12 NOTE — Telephone Encounter (Signed)
LMOM for patient w/information & contact name & number to make aware of new medicaitons and reason for them. Informed to call back for lab appointment & that I would try to reach again at later time. New Rx[s] to pharmacy. Lab order placed in EMR.

## 2011-01-13 ENCOUNTER — Other Ambulatory Visit: Payer: Self-pay | Admitting: Internal Medicine

## 2011-01-14 NOTE — Telephone Encounter (Signed)
Done

## 2011-01-21 NOTE — Telephone Encounter (Signed)
Left message on mobile VM to reiterate previous msge.

## 2011-01-22 ENCOUNTER — Other Ambulatory Visit: Payer: Self-pay

## 2011-01-22 ENCOUNTER — Other Ambulatory Visit: Payer: Self-pay | Admitting: Internal Medicine

## 2011-01-22 DIAGNOSIS — Z0000 Encounter for general adult medical examination without abnormal findings: Secondary | ICD-10-CM

## 2011-01-26 ENCOUNTER — Telehealth: Payer: Self-pay

## 2011-01-26 NOTE — Telephone Encounter (Signed)
Message copied by Beverely Low on Mon Jan 26, 2011  1:44 PM ------      Message from: Vernie Murders      Created: Caleen Essex Jan 23, 2011  6:11 PM       Left mess to call office back.

## 2011-01-26 NOTE — Telephone Encounter (Signed)
Left message to notify pt  

## 2011-01-26 NOTE — Progress Notes (Signed)
Quick Note:  Left message to notify pt of results ______ 

## 2011-01-30 NOTE — Telephone Encounter (Signed)
Aware of IFOB

## 2011-08-18 ENCOUNTER — Other Ambulatory Visit: Payer: Self-pay | Admitting: Internal Medicine

## 2011-08-18 NOTE — Telephone Encounter (Signed)
Refill done.  

## 2011-11-23 ENCOUNTER — Emergency Department (HOSPITAL_COMMUNITY): Payer: Worker's Compensation

## 2011-11-23 ENCOUNTER — Encounter (HOSPITAL_COMMUNITY): Payer: Self-pay | Admitting: *Deleted

## 2011-11-23 ENCOUNTER — Inpatient Hospital Stay (HOSPITAL_COMMUNITY)
Admission: EM | Admit: 2011-11-23 | Discharge: 2011-12-01 | DRG: 505 | Disposition: A | Discharge status: Skilled Nursing Facility | Payer: Worker's Compensation | Attending: Orthopedic Surgery | Admitting: Orthopedic Surgery

## 2011-11-23 DIAGNOSIS — S92009A Unspecified fracture of unspecified calcaneus, initial encounter for closed fracture: Principal | ICD-10-CM | POA: Diagnosis present

## 2011-11-23 DIAGNOSIS — I10 Essential (primary) hypertension: Secondary | ICD-10-CM | POA: Diagnosis present

## 2011-11-23 DIAGNOSIS — W1789XA Other fall from one level to another, initial encounter: Secondary | ICD-10-CM | POA: Diagnosis present

## 2011-11-23 DIAGNOSIS — F17200 Nicotine dependence, unspecified, uncomplicated: Secondary | ICD-10-CM | POA: Diagnosis present

## 2011-11-23 DIAGNOSIS — Z7982 Long term (current) use of aspirin: Secondary | ICD-10-CM

## 2011-11-23 DIAGNOSIS — Y929 Unspecified place or not applicable: Secondary | ICD-10-CM

## 2011-11-23 DIAGNOSIS — S92001A Unspecified fracture of right calcaneus, initial encounter for closed fracture: Secondary | ICD-10-CM

## 2011-11-23 DIAGNOSIS — E785 Hyperlipidemia, unspecified: Secondary | ICD-10-CM | POA: Diagnosis present

## 2011-11-23 DIAGNOSIS — E119 Type 2 diabetes mellitus without complications: Secondary | ICD-10-CM | POA: Diagnosis present

## 2011-11-23 MED ORDER — OXYCODONE HCL 5 MG PO TABS
10.0000 mg | ORAL_TABLET | ORAL | Status: DC | PRN
  Administered 2011-11-24 (×2): 10 mg via ORAL
  Filled 2011-11-23 (×2): qty 2

## 2011-11-23 MED ORDER — INSULIN ASPART 100 UNIT/ML ~~LOC~~ SOLN
0.0000 [IU] | Freq: Three times a day (TID) | SUBCUTANEOUS | Status: DC
  Administered 2011-11-24: 2 [IU] via SUBCUTANEOUS
  Administered 2011-11-24: 1 [IU] via SUBCUTANEOUS

## 2011-11-23 MED ORDER — MORPHINE SULFATE 4 MG/ML IJ SOLN
4.0000 mg | INTRAMUSCULAR | Status: DC | PRN
  Administered 2011-11-23 – 2011-11-24 (×4): 4 mg via INTRAVENOUS
  Filled 2011-11-23 (×4): qty 1

## 2011-11-23 MED ORDER — LOSARTAN POTASSIUM 25 MG PO TABS
25.0000 mg | ORAL_TABLET | Freq: Every day | ORAL | Status: DC
  Administered 2011-11-24: 25 mg via ORAL
  Filled 2011-11-23: qty 1

## 2011-11-23 MED ORDER — METFORMIN HCL 500 MG PO TABS
1000.0000 mg | ORAL_TABLET | Freq: Every day | ORAL | Status: DC
  Administered 2011-11-24: 1000 mg via ORAL
  Filled 2011-11-23 (×3): qty 2

## 2011-11-23 MED ORDER — ATORVASTATIN CALCIUM 10 MG PO TABS
10.0000 mg | ORAL_TABLET | Freq: Every day | ORAL | Status: DC
  Administered 2011-11-24: 10 mg via ORAL
  Filled 2011-11-23: qty 1

## 2011-11-23 NOTE — Consult Note (Signed)
  56 yo with Bilateral calcaneal fractures s/p fall from scaffold today. Work related injury Denies other ortho complaints - no loc Can move toes by report Will admit tonight for pain control and have one of my partners eval in am for surgery Well padded post splints to be applied

## 2011-11-23 NOTE — ED Provider Notes (Addendum)
History     CSN: [id number]  Arrival date & time 11/23/11  1705   First MD Initiated Contact with Patient 11/23/11 1944      Chief Complaint  Patient presents with  . Foot Swelling    Bilateral    (Consider location/radiation/quality/duration/timing/severity/associated sxs/prior treatment) The history is provided by the patient.   Patient here complaining of bilateral calcaneal pain after falling off a scaffold approximately 5 feet from the ground. No loss of consciousness. Denies any back hip or knee pain. Pain is worse with ambulation described as sharp. Rest makes it better. Denies any numbness to his feet Past Medical History  Diagnosis Date  . Hypertension   . Hyperlipidemia   . Diabetes mellitus     type 2    Past Surgical History  Procedure Date  . No past surgeries     Family History  Problem Relation Age of Onset  . Hypertension Mother   . Diabetes Mother   . Dementia Mother   . Coronary artery disease Neg Hx   . Colon cancer Neg Hx   . Prostate cancer Neg Hx     History  Substance Use Topics  . Smoking status: Current Everyday Smoker -- 1 years    Types: Cigarettes  . Smokeless tobacco: Never Used  . Alcohol Use: No      Review of Systems  Allergies  Review of patient's allergies indicates no known allergies.  Home Medications   Current Outpatient Rx  Name Route Sig Dispense Refill  . ACETAMINOPHEN 500 MG PO TABS Oral Take 1,000 mg by mouth every 6 (six) hours as needed. For pain.    . ASPIRIN EC 81 MG PO TBEC Oral Take 81 mg by mouth daily.    . ATORVASTATIN CALCIUM 10 MG PO TABS Oral Take 1 tablet (10 mg total) by mouth daily. 30 tablet 3  . LOSARTAN POTASSIUM 25 MG PO TABS Oral Take 1 tablet (25 mg total) by mouth daily. 30 tablet 11  . METFORMIN HCL 1000 MG PO TABS Oral Take 1,000 mg by mouth daily.      BP 177/82  Pulse 95  Temp 98.4 F (36.9 C) (Oral)  Resp 16  Ht 5\' 7"  (1.702 m)  Wt 168 lb (76.204 kg)  BMI 26.31 kg/m2  SpO2  97%  Physical Exam  Nursing note and vitals reviewed. Constitutional: He is oriented to person, place, and time. He appears well-developed and well-nourished.  Non-toxic appearance. No distress.  HENT:  Head: Normocephalic and atraumatic.  Eyes: Conjunctivae, EOM and lids are normal. Pupils are equal, round, and reactive to light.  Neck: Normal range of motion. Neck supple. No tracheal deviation present. No mass present.  Cardiovascular: Normal rate, regular rhythm and normal heart sounds.  Exam reveals no gallop.   No murmur heard. Pulmonary/Chest: Effort normal and breath sounds normal. No stridor. No respiratory distress. He has no decreased breath sounds. He has no wheezes. He has no rhonchi. He has no rales.  Abdominal: Soft. Normal appearance and bowel sounds are normal. He exhibits no distension. There is no tenderness. There is no rebound and no CVA tenderness.  Musculoskeletal: Normal range of motion. He exhibits no edema and no tenderness.       Patient with bilateral ankle swelling and tenderness to palpation. Some ecchymosis noted. Bilateral dorsalis pedis pulses are palpable. Patient is tender at the calcaneus on both feet.  Neurological: He is alert and oriented to person, place, and time. He has  normal strength. No cranial nerve deficit or sensory deficit. GCS eye subscore is 4. GCS verbal subscore is 5. GCS motor subscore is 6.  Skin: Skin is warm and dry. No abrasion and no rash noted.  Psychiatric: He has a normal mood and affect. His speech is normal and behavior is normal.    ED Course  Procedures (including critical care time)  Labs Reviewed - No data to display Dg Ankle Complete Left  11/23/2011  *RADIOLOGY REPORT*  Clinical Data: Ankle pain and swelling secondary to trauma.  LEFT ANKLE COMPLETE - 3+ VIEW  Comparison: None.  Findings: There is a comminuted impacted angulated fracture of the calcaneus.  Distal tibia and fibula are intact.  Talus appears to be intact.   IMPRESSION: Comminuted impacted angulated fracture of the calcaneus.   Original Report Authenticated By: Gwynn Burly, M.D.    Dg Ankle Complete Right  11/23/2011  *RADIOLOGY REPORT*  Clinical Data: Right ankle pain following an injury today.  RIGHT ANKLE - COMPLETE 3+ VIEW  Comparison: Right foot radiographs obtained at the same time.  Findings: Again demonstrated is a comminuted calcaneus fracture. There is also pronounced lateral soft tissue swelling.  No effusion is seen.  No additional fractures are visualized.  IMPRESSION: Comminuted calcaneus fracture and lateral soft tissue swelling.   Original Report Authenticated By: Darrol Angel, M.D.    Dg Foot Complete Left  11/23/2011  *RADIOLOGY REPORT*  Clinical Data: Left foot pain and swelling secondary to trauma.  LEFT FOOT - COMPLETE 3+ VIEW  Comparison: None.  Findings: There is a comminuted impacted angulated fracture of the calcaneus.  Some fragments are displaced.  The other bones of the foot are intact.  IMPRESSION: Comminuted, impacted, angulated and displaced fractures of the calcaneus.   Original Report Authenticated By: Gwynn Burly, M.D.    Dg Foot Complete Right  11/23/2011  *RADIOLOGY REPORT*  Clinical Data: Right foot pain following an injury.  RIGHT FOOT COMPLETE - 3+ VIEW  Comparison: None.  Findings: Diffuse soft tissue swelling, most pronounced dorsally. Minimally displaced, comminuted calcaneus fracture.  No other fractures are visualized.  IMPRESSION: Comminuted calcaneus fracture.  This could be better characterized with CT.   Original Report Authenticated By: Darrol Angel, M.D.      No diagnosis found.    MDM  Spoke with Dr. August Saucer, patient to be admitted for treatment of his bilateral calcaneal fractures       Toy Baker, MD 11/23/11 2254  Toy Baker, MD 11/23/11 [phone]

## 2011-11-23 NOTE — ED Notes (Signed)
Pt sts he was working on a scaffold approximately 5 foot off the groundand had to jump off due to the scaffold falling, patient landed on rebar. Patient landed upright on both feet. Bilateral feet and ankles swollen. Patient able to move all toes,pulses palpable in both feet. Patient non ambulatory.

## 2011-11-24 ENCOUNTER — Inpatient Hospital Stay (HOSPITAL_COMMUNITY): Payer: Worker's Compensation

## 2011-11-24 ENCOUNTER — Encounter (HOSPITAL_COMMUNITY): Payer: Self-pay

## 2011-11-24 DIAGNOSIS — S92001A Unspecified fracture of right calcaneus, initial encounter for closed fracture: Secondary | ICD-10-CM

## 2011-11-24 LAB — BASIC METABOLIC PANEL
BUN: 14 mg/dL (ref 6–23)
BUN: 15 mg/dL (ref 6–23)
CO2: 25 mEq/L (ref 19–32)
Calcium: 8.9 mg/dL (ref 8.4–10.5)
Chloride: 103 mEq/L (ref 96–112)
Creatinine, Ser: 0.79 mg/dL (ref 0.50–1.35)
GFR calc Af Amer: >90 mL/min (ref >90)
GFR calc non Af Amer: >90 mL/min (ref >90)
Glucose, Bld: 108 mg/dL — ABNORMAL HIGH (ref 70–99)
Potassium: 3.6 mEq/L (ref 3.5–5.1)
Sodium: 140 mEq/L (ref 135–145)

## 2011-11-24 LAB — CBC
HCT: 33.6 % — ABNORMAL LOW (ref 39.0–52.0)
MCHC: 32.7 g/dL (ref 30.0–36.0)
MCV: 63 fL — ABNORMAL LOW (ref 78.0–100.0)
RDW: 17.5 % — ABNORMAL HIGH (ref 11.5–15.5)
WBC: 10.2 10*3/uL (ref 4.0–10.5)

## 2011-11-24 LAB — CBC WITH DIFFERENTIAL/PLATELET
Eosinophils Absolute: 0.2 10*3/uL (ref 0.0–0.7)
Eosinophils Relative: 2 % (ref 0–5)
Hemoglobin: 11.3 g/dL — ABNORMAL LOW (ref 13.0–17.0)
Lymphocytes Relative: 20 % (ref 12–46)
Lymphs Abs: 2.4 10*3/uL (ref 0.7–4.0)
MCH: 20.9 pg — ABNORMAL LOW (ref 26.0–34.0)
MCV: 63 fL — ABNORMAL LOW (ref 78.0–100.0)
Monocytes Relative: 12 % (ref 3–12)
Platelets: 202 10*3/uL (ref 150–400)
RBC: 5.4 MIL/uL (ref 4.22–5.81)
WBC: 12.4 10*3/uL — ABNORMAL HIGH (ref 4.0–10.5)

## 2011-11-24 MED ORDER — INSULIN ASPART 100 UNIT/ML ~~LOC~~ SOLN
0.0000 [IU] | SUBCUTANEOUS | Status: DC
  Administered 2011-11-25: 2 [IU] via SUBCUTANEOUS

## 2011-11-24 MED ORDER — POTASSIUM CHLORIDE IN NACL 20-0.9 MEQ/L-% IV SOLN
INTRAVENOUS | Status: DC
  Administered 2011-11-24: 04:00:00 via INTRAVENOUS
  Filled 2011-11-24 (×2): qty 1000

## 2011-11-24 MED ORDER — NALOXONE HCL 0.4 MG/ML IJ SOLN: 0.4000 mg | INTRAMUSCULAR | Status: DC | PRN

## 2011-11-24 MED ORDER — DEXTROSE 5 % IV SOLN
3.0000 g | INTRAVENOUS | Status: DC
  Filled 2011-11-24: qty 3000

## 2011-11-24 MED ORDER — DIPHENHYDRAMINE HCL 12.5 MG/5ML PO ELIX: 12.5000 mg | ORAL_SOLUTION | Freq: Four times a day (QID) | ORAL | Status: DC | PRN

## 2011-11-24 MED ORDER — DIPHENHYDRAMINE HCL 50 MG/ML IJ SOLN: 12.5000 mg | Freq: Four times a day (QID) | INTRAMUSCULAR | Status: DC | PRN

## 2011-11-24 MED ORDER — SODIUM CHLORIDE 0.9 % IJ SOLN: 9.0000 mL | INTRAMUSCULAR | Status: DC | PRN

## 2011-11-24 MED ORDER — ASPIRIN EC 81 MG PO TBEC
81.0000 mg | DELAYED_RELEASE_TABLET | Freq: Every day | ORAL | Status: DC
  Administered 2011-11-25: 81 mg via ORAL
  Filled 2011-11-24: qty 1

## 2011-11-24 MED ORDER — SODIUM CHLORIDE 0.9 % IV SOLN
INTRAVENOUS | Status: DC
  Administered 2011-11-25: 01:00:00 via INTRAVENOUS

## 2011-11-24 MED ORDER — ATORVASTATIN CALCIUM 10 MG PO TABS
10.0000 mg | ORAL_TABLET | Freq: Every day | ORAL | Status: DC
  Administered 2011-11-25: 10 mg via ORAL
  Filled 2011-11-24: qty 1

## 2011-11-24 MED ORDER — ONDANSETRON HCL 4 MG/2ML IJ SOLN: 4.0000 mg | Freq: Four times a day (QID) | INTRAMUSCULAR | Status: DC | PRN

## 2011-11-24 MED ORDER — LOSARTAN POTASSIUM 25 MG PO TABS
25.0000 mg | ORAL_TABLET | Freq: Every day | ORAL | Status: DC
  Administered 2011-11-25: 25 mg via ORAL
  Filled 2011-11-24: qty 1

## 2011-11-24 MED ORDER — METFORMIN HCL 500 MG PO TABS
500.0000 mg | ORAL_TABLET | Freq: Every day | ORAL | Status: DC
  Administered 2011-11-25: 500 mg via ORAL
  Filled 2011-11-24 (×2): qty 1

## 2011-11-24 MED ORDER — HYDROMORPHONE 0.3 MG/ML IV SOLN
INTRAVENOUS | Status: DC
  Administered 2011-11-25: 0.3 mL via INTRAVENOUS
  Filled 2011-11-24: qty 25

## 2011-11-24 NOTE — Progress Notes (Signed)
Pt transported to [facility] bed 24 via carelink. Report called to RN on unit. Pt family at bedside when carelink arrived and took patient belongings with her. No changes since am assessment.  MCCLAIN, Nicoletta Hush L 11/24/2011 8:54 PM

## 2011-11-24 NOTE — Progress Notes (Signed)
Patient ID: Tristan Figueroa, male   DOB: [birth year], 56 y.o.   MRN: [id number] Received call from [facility] transfer forms required signing prior to patients transfer this evening, done, received call from [facility] new admission orders needed for this patient  now that he is transferred, done. Family requested to talk about upcoming surgery and will defer to Dr. Lajoyce Corners this patient's attending surgeon.

## 2011-11-24 NOTE — H&P (Signed)
Tristan Figueroa is an 56 y.o. male.   Chief Complaint: Bilateral calcaneal fractures HPI: Patient is a 56 year old Tristan Figueroa with type 2 diabetes hemoglobin A1c greater than 7 with a long-standing history of smoking who jumped off a scaffolding approximately 5 feet and elevation landed on both feet sustaining bilateral calcaneal fractures.  Past Medical History  Diagnosis Date  . Hypertension   . Hyperlipidemia   . Diabetes mellitus     type 2    Past Surgical History  Procedure Date  . No past surgeries     Family History  Problem Relation Age of Onset  . Hypertension Mother   . Diabetes Mother   . Dementia Mother   . Coronary artery disease Neg Hx   . Colon cancer Neg Hx   . Prostate cancer Neg Hx    Social History:  reports that he has been smoking Cigarettes.  He has smoked for the past 1 year. He has never used smokeless tobacco. He reports that he does not drink alcohol or use illicit drugs.  Allergies: No Known Allergies  Medications Prior to Admission  Medication Sig Dispense Refill  . acetaminophen (TYLENOL) 500 MG tablet Take 1,000 mg by mouth every 6 (six) hours as needed. For pain.      Marland Kitchen aspirin EC 81 MG tablet Take 81 mg by mouth daily.      Marland Kitchen atorvastatin (LIPITOR) 10 MG tablet Take 1 tablet (10 mg total) by mouth daily.  30 tablet  3  . losartan (COZAAR) 25 MG tablet Take 1 tablet (25 mg total) by mouth daily.  30 tablet  11  . metFORMIN (GLUCOPHAGE) 1000 MG tablet Take 1,000 mg by mouth daily.        Results for orders placed during the hospital encounter of 11/23/11 (from the past 48 hour(s))  CBC WITH DIFFERENTIAL     Status: Abnormal   Collection Time   11/23/11 11:51 PM      Component Value Range Comment   WBC 12.4 (*) 4.0 - 10.5 K/uL    RBC 5.40  4.22 - 5.81 MIL/uL    Hemoglobin 11.3 (*) 13.0 - 17.0 g/dL    HCT 16.1 (*) 09.6 - 52.0 %    MCV 63.0 (*) 78.0 - 100.0 fL    MCH 20.9 (*) 26.0 - 34.0 pg    MCHC 33.2  30.0 - 36.0 g/dL    RDW 04.5 (*)  40.9 - 15.5 %    Platelets 202  150 - 400 K/uL    Neutrophils Relative 67  43 - 77 %    Neutro Abs 8.3 (*) 1.7 - 7.7 K/uL    Lymphocytes Relative 20  12 - 46 %    Lymphs Abs 2.4  0.7 - 4.0 K/uL    Monocytes Relative 12  3 - 12 %    Monocytes Absolute 1.4 (*) 0.1 - 1.0 K/uL    Eosinophils Relative 2  0 - 5 %    Eosinophils Absolute 0.2  0.0 - 0.7 K/uL    Basophils Relative 0  0 - 1 %    Basophils Absolute 0.0  0.0 - 0.1 K/uL   BASIC METABOLIC PANEL     Status: Abnormal   Collection Time   11/23/11 11:51 PM      Component Value Range Comment   Sodium 140  135 - 145 mEq/L    Potassium 3.6  3.5 - 5.1 mEq/L    Chloride 105  96 - 112 mEq/L  CO2 25  19 - 32 mEq/L    Glucose, Bld 108 (*) 70 - 99 mg/dL    BUN 15  6 - 23 mg/dL    Creatinine, Ser 1.61  0.50 - 1.35 mg/dL    Calcium 8.9  8.4 - 09.6 mg/dL    GFR calc non Af Amer >90  >90 mL/min    GFR calc Af Amer >90  >90 mL/min   CBC     Status: Abnormal   Collection Time   11/24/11  4:40 AM      Component Value Range Comment   WBC 10.2  4.0 - 10.5 K/uL    RBC 5.33  4.22 - 5.81 MIL/uL    Hemoglobin 11.0 (*) 13.0 - 17.0 g/dL    HCT 04.5 (*) 40.9 - 52.0 %    MCV 63.0 (*) 78.0 - 100.0 fL    MCH 20.6 (*) 26.0 - 34.0 pg    MCHC 32.7  30.0 - 36.0 g/dL    RDW 81.1 (*) 91.4 - 15.5 %    Platelets 193  150 - 400 K/uL   BASIC METABOLIC PANEL     Status: Abnormal   Collection Time   11/24/11  4:40 AM      Component Value Range Comment   Sodium 137  135 - 145 mEq/L    Potassium 3.4 (*) 3.5 - 5.1 mEq/L    Chloride 103  96 - 112 mEq/L    CO2 25  19 - 32 mEq/L    Glucose, Bld 148 (*) 70 - 99 mg/dL    BUN 14  6 - 23 mg/dL    Creatinine, Ser 7.82  0.50 - 1.35 mg/dL    Calcium 8.7  8.4 - 95.6 mg/dL    GFR calc non Af Amer >90  >90 mL/min    GFR calc Af Amer >90  >90 mL/min    Dg Ankle Complete Left  11/23/2011  *RADIOLOGY REPORT*  Clinical Data: Ankle pain and swelling secondary to trauma.  LEFT ANKLE COMPLETE - 3+ VIEW  Comparison: None.   Findings: There is a comminuted impacted angulated fracture of the calcaneus.  Distal tibia and fibula are intact.  Talus appears to be intact.  IMPRESSION: Comminuted impacted angulated fracture of the calcaneus.   Original Report Authenticated By: Gwynn Burly, M.D.    Dg Ankle Complete Right  11/23/2011  *RADIOLOGY REPORT*  Clinical Data: Right ankle pain following an injury today.  RIGHT ANKLE - COMPLETE 3+ VIEW  Comparison: Right foot radiographs obtained at the same time.  Findings: Again demonstrated is a comminuted calcaneus fracture. There is also pronounced lateral soft tissue swelling.  No effusion is seen.  No additional fractures are visualized.  IMPRESSION: Comminuted calcaneus fracture and lateral soft tissue swelling.   Original Report Authenticated By: Darrol Angel, M.D.    Ct Foot Left Wo Contrast  11/23/2011  *RADIOLOGY REPORT*  Clinical Data: Fall.  Calcaneus fracture.  CT OF THE LEFT FOOT WITHOUT CONTRAST  Technique:  Multidetector CT imaging was performed according to the standard protocol. Multiplanar CT image reconstructions were also generated.  Comparison: Plain films earlier this same date.  Findings: The patient has a comminuted calcaneus fracture.  The calcaneus is divided into four main fragments.  The fracture includes a component extending through the sustentaculum at the lateral most margin of the middle facet. The sustentaculum is a separate fragment.  There is a prominent vertical component of the fracture through the posterior facet  of the subtalar joint with a step off of 0.4 cm involving the lateral aspect of the posterior facet including an area measuring 0.8 cm transverse by 1.3 cm AP at the articular surface There is also a longitudinal component of the fracture extending to the far periphery of the calcaneus at the calcaneocuboid joint.  The anterior process of the calcaneus is also fractured.  Soft tissues demonstrate extensive hematoma and swelling.  The  peroneal tendons pass along the patient's calcaneus fracture but do not appear entrapped.  Except as noted, no other fractures identified.  IMPRESSION: Comminuted calcaneus fracture involves the posterior facet of the subtalar joint where there is 0.4 cm of impaction of the lateral posterior aspect of the posterior facet.   Original Report Authenticated By: Bernadene Bell. Maricela Curet, M.D.    Ct Foot Right Wo Contrast  11/23/2011  *RADIOLOGY REPORT*  Clinical Data: Status post fall.  Calcaneal fractures.  CT OF THE RIGHT FOOT WITHOUT CONTRAST  Technique:  Multidetector CT imaging was performed according to the standard protocol. Multiplanar CT image reconstructions were also generated.  Comparison: Plain films 11/23/2011 at 1845 hours.  Findings: There is a calcaneal fracture as seen on plain films. The calcaneus is divided into three main fragments.   One component of the fracture originates approximately 1 cm posterior to the posterior facet of the subtalar joint and extends in an inferior and oblique anterior orientation through the plantar surface of the calcaneus. A transverse component of the fracture extends from the base of the sustentaculum through the lateral most margin of the posterior facet of the calcaneus.  The articular surface of the middle facet is preserved.  The fracture also extends to the articular surface of the calcaneus at the calcaneocuboid joint without displacement.  The patient also has a mild impaction fracture of the articular surface of the cuboid at the calcaneocuboid joint.  Longitudinal component of this fracture extends to the medial most aspect of the base of the fourth metatarsal without displacement.  No other fracture is identified.  There is soft tissue swelling about the foot. The peroneal tendons pass along the margin of the patient's calcaneal fracture but do not appear entrapped.  IMPRESSION:  1.  Calcaneus fracture as described above involves the posterior facet of the  subtalar joint and calcaneocuboid joint without displacement in either location. 2.  Mildly impacted fracture the articular surface of the navicular bone is also identified. 3.  The peroneal tendons pass along the patient's calcaneus fracture but do not appear entrapped.   Original Report Authenticated By: Bernadene Bell. D'ALESSIO, M.D.    Dg Foot Complete Left  11/23/2011  *RADIOLOGY REPORT*  Clinical Data: Left foot pain and swelling secondary to trauma.  LEFT FOOT - COMPLETE 3+ VIEW  Comparison: None.  Findings: There is a comminuted impacted angulated fracture of the calcaneus.  Some fragments are displaced.  The other bones of the foot are intact.  IMPRESSION: Comminuted, impacted, angulated and displaced fractures of the calcaneus.   Original Report Authenticated By: Gwynn Burly, M.D.    Dg Foot Complete Right  11/23/2011  *RADIOLOGY REPORT*  Clinical Data: Right foot pain following an injury.  RIGHT FOOT COMPLETE - 3+ VIEW  Comparison: None.  Findings: Diffuse soft tissue swelling, most pronounced dorsally. Minimally displaced, comminuted calcaneus fracture.  No other fractures are visualized.  IMPRESSION: Comminuted calcaneus fracture.  This could be better characterized with CT.   Original Report Authenticated By: Darrol Angel, M.D.  Review of Systems  All other systems reviewed and are negative.    Blood pressure 143/69, pulse 87, temperature 98.9 F (37.2 C), temperature source Oral, resp. rate 18, height 5' 7.5" (1.715 m), weight 76.5 kg (168 lb 10.4 oz), SpO2 98.00%. Physical Exam  On examination patient has a palpable dorsalis pedis pulse he does have swelling in both lower extremities. Review of the radiographs and CT scans shows a nondisplaced calcaneus fracture of the right with a displaced posterior facet Sanders three-part calcaneus fracture on the left. Assessment/Plan Assessment: #1 nondisplaced right calcaneus fracture #2 left calcaneus fracture with three-part Sanders  posterior facet fracture. Plan: Discussed with the patient that the right calcaneus fracture can be treated nonoperatively. Discussed that to restore the congruence of the posterior facet surgical intervention would be necessary for the left calcaneus fracture. Discussed the patient is at increased risk of infection wound not healing potential for  amputation of the left foot do to his diabetes and smoking history. Patient states that he will stop smoking at this time. Patient will need to be admitted postoperatively to short-term skilled nursing care will plan for surgery Wednesday or Friday.  Caley Ciaramitaro V 11/24/2011, 6:28 AM

## 2011-11-24 NOTE — Progress Notes (Signed)
Pt received via carelink from [facility]. Pt does not have orders from transfer. Contacted on call Md and advised him that no orders had been written for release after the transfer.

## 2011-11-25 ENCOUNTER — Encounter (HOSPITAL_COMMUNITY): Payer: Self-pay | Admitting: Anesthesiology

## 2011-11-25 ENCOUNTER — Encounter (HOSPITAL_COMMUNITY)
Admission: EM | Disposition: A | Discharge status: Skilled Nursing Facility | Payer: Self-pay | Source: Home / Self Care | Attending: Orthopedic Surgery

## 2011-11-25 ENCOUNTER — Inpatient Hospital Stay (HOSPITAL_COMMUNITY): Payer: Worker's Compensation | Admitting: Anesthesiology

## 2011-11-25 HISTORY — PX: ORIF CALCANEOUS FRACTURE: SHX5030

## 2011-11-25 LAB — CBC
HCT: 34.6 % — ABNORMAL LOW (ref 39.0–52.0)
Hemoglobin: 11.3 g/dL — ABNORMAL LOW (ref 13.0–17.0)
MCV: 64.1 fL — ABNORMAL LOW (ref 78.0–100.0)
RBC: 5.4 MIL/uL (ref 4.22–5.81)
WBC: 10.9 10*3/uL — ABNORMAL HIGH (ref 4.0–10.5)

## 2011-11-25 LAB — GLUCOSE, CAPILLARY
Glucose-Capillary: 119 mg/dL — ABNORMAL HIGH (ref 70–99)
Glucose-Capillary: 117 mg/dL — ABNORMAL HIGH (ref 70–99)
Glucose-Capillary: 109 mg/dL — ABNORMAL HIGH (ref 70–99)
Glucose-Capillary: 99 mg/dL (ref 70–99)

## 2011-11-25 LAB — BASIC METABOLIC PANEL
BUN: 12 mg/dL (ref 6–23)
CO2: 27 mEq/L (ref 19–32)
Chloride: 104 mEq/L (ref 96–112)
Creatinine, Ser: 0.86 mg/dL (ref 0.50–1.35)
GFR calc Af Amer: >90 mL/min (ref >90)
Potassium: 3.5 mEq/L (ref 3.5–5.1)

## 2011-11-25 SURGERY — OPEN REDUCTION INTERNAL FIXATION (ORIF) CALCANEOUS FRACTURE
Anesthesia: General | Site: Foot | Laterality: Left | Wound class: Clean

## 2011-11-25 MED ORDER — METOCLOPRAMIDE HCL 10 MG PO TABS: 5.0000 mg | ORAL_TABLET | Freq: Three times a day (TID) | ORAL | Status: DC | PRN

## 2011-11-25 MED ORDER — HYDROCODONE-ACETAMINOPHEN 5-325 MG PO TABS
1.0000 | ORAL_TABLET | ORAL | Status: DC | PRN
  Administered 2011-11-26 – 2011-11-27 (×3): 2 via ORAL
  Administered 2011-11-28: 1 via ORAL
  Administered 2011-11-28 – 2011-11-29 (×4): 2 via ORAL
  Filled 2011-11-25: qty 1
  Filled 2011-11-25 (×7): qty 2

## 2011-11-25 MED ORDER — LACTATED RINGERS IV SOLN
INTRAVENOUS | Status: DC
  Administered 2011-11-25: 17:00:00 via INTRAVENOUS

## 2011-11-25 MED ORDER — ONDANSETRON HCL 4 MG PO TABS: 4.0000 mg | ORAL_TABLET | Freq: Four times a day (QID) | ORAL | Status: DC | PRN

## 2011-11-25 MED ORDER — PATIENT'S GUIDE TO USING COUMADIN BOOK
Freq: Once | Status: DC
  Filled 2011-11-25: qty 1

## 2011-11-25 MED ORDER — SUCCINYLCHOLINE CHLORIDE 20 MG/ML IJ SOLN
INTRAMUSCULAR | Status: DC | PRN
  Administered 2011-11-25: 100 mg via INTRAVENOUS

## 2011-11-25 MED ORDER — HYDROMORPHONE HCL PF 1 MG/ML IJ SOLN
0.5000 mg | INTRAMUSCULAR | Status: DC | PRN
  Administered 2011-11-25 – 2011-11-26 (×5): 1 mg via INTRAVENOUS
  Filled 2011-11-25 (×5): qty 1

## 2011-11-25 MED ORDER — DEXTROSE 5 % IV SOLN
500.0000 mg | Freq: Four times a day (QID) | INTRAVENOUS | Status: DC | PRN
  Filled 2011-11-25: qty 5

## 2011-11-25 MED ORDER — METOCLOPRAMIDE HCL 5 MG/ML IJ SOLN: 5.0000 mg | Freq: Three times a day (TID) | INTRAMUSCULAR | Status: DC | PRN

## 2011-11-25 MED ORDER — MIDAZOLAM HCL 5 MG/5ML IJ SOLN
INTRAMUSCULAR | Status: DC | PRN
  Administered 2011-11-25: 2 mg via INTRAVENOUS

## 2011-11-25 MED ORDER — PHENYLEPHRINE HCL 10 MG/ML IJ SOLN
INTRAMUSCULAR | Status: DC | PRN
  Administered 2011-11-25 (×3): 80 ug via INTRAVENOUS
  Administered 2011-11-25: 120 ug via INTRAVENOUS
  Administered 2011-11-25: 80 ug via INTRAVENOUS

## 2011-11-25 MED ORDER — DEXTROSE 5 % IV SOLN
3.0000 g | INTRAVENOUS | Status: DC | PRN
  Administered 2011-11-25: 3 g via INTRAVENOUS

## 2011-11-25 MED ORDER — CEFAZOLIN SODIUM-DEXTROSE 2-3 GM-% IV SOLR
2.0000 g | Freq: Four times a day (QID) | INTRAVENOUS | Status: AC
  Administered 2011-11-25 – 2011-11-26 (×3): 2 g via INTRAVENOUS
  Filled 2011-11-25 (×4): qty 50

## 2011-11-25 MED ORDER — ONDANSETRON HCL 4 MG/2ML IJ SOLN
INTRAMUSCULAR | Status: DC | PRN
  Administered 2011-11-25: 4 mg via INTRAVENOUS

## 2011-11-25 MED ORDER — FENTANYL CITRATE 0.05 MG/ML IJ SOLN
INTRAMUSCULAR | Status: DC | PRN
  Administered 2011-11-25 (×2): 100 ug via INTRAVENOUS
  Administered 2011-11-25: 50 ug via INTRAVENOUS

## 2011-11-25 MED ORDER — LIDOCAINE HCL (CARDIAC) 20 MG/ML IV SOLN
INTRAVENOUS | Status: DC | PRN
  Administered 2011-11-25: 80 mg via INTRAVENOUS

## 2011-11-25 MED ORDER — WARFARIN VIDEO: Freq: Once | Status: DC

## 2011-11-25 MED ORDER — WARFARIN SODIUM 5 MG PO TABS
5.0000 mg | ORAL_TABLET | Freq: Every day | ORAL | Status: DC
  Administered 2011-11-25 – 2011-11-30 (×6): 5 mg via ORAL
  Filled 2011-11-25 (×7): qty 1

## 2011-11-25 MED ORDER — PROPOFOL 10 MG/ML IV BOLUS
INTRAVENOUS | Status: DC | PRN
  Administered 2011-11-25: 200 mg via INTRAVENOUS

## 2011-11-25 MED ORDER — METHOCARBAMOL 500 MG PO TABS
500.0000 mg | ORAL_TABLET | Freq: Four times a day (QID) | ORAL | Status: DC | PRN
  Administered 2011-11-25 – 2011-11-30 (×6): 500 mg via ORAL
  Filled 2011-11-25 (×6): qty 1

## 2011-11-25 MED ORDER — ALBUMIN HUMAN 5 % IV SOLN
INTRAVENOUS | Status: DC | PRN
  Administered 2011-11-25: 19:00:00 via INTRAVENOUS

## 2011-11-25 MED ORDER — LACTATED RINGERS IV SOLN
INTRAVENOUS | Status: DC | PRN
  Administered 2011-11-25 (×3): via INTRAVENOUS

## 2011-11-25 MED ORDER — SODIUM CHLORIDE 0.9 % IV SOLN: INTRAVENOUS | Status: DC

## 2011-11-25 MED ORDER — WARFARIN - PHARMACIST DOSING INPATIENT: Freq: Every day | Status: DC

## 2011-11-25 MED ORDER — CEFAZOLIN SODIUM-DEXTROSE 2-3 GM-% IV SOLR
2.0000 g | INTRAVENOUS | Status: DC
  Filled 2011-11-25: qty 50

## 2011-11-25 MED ORDER — OXYCODONE-ACETAMINOPHEN 5-325 MG PO TABS
1.0000 | ORAL_TABLET | ORAL | Status: DC | PRN
  Administered 2011-11-25 – 2011-12-01 (×10): 2 via ORAL
  Filled 2011-11-25 (×10): qty 2

## 2011-11-25 MED ORDER — ONDANSETRON HCL 4 MG/2ML IJ SOLN: 4.0000 mg | Freq: Four times a day (QID) | INTRAMUSCULAR | Status: DC | PRN

## 2011-11-25 SURGICAL SUPPLY — 54 items
BANDAGE ELASTIC 4 VELCRO ST LF (GAUZE/BANDAGES/DRESSINGS) IMPLANT
BANDAGE ELASTIC 6 VELCRO ST LF (GAUZE/BANDAGES/DRESSINGS) IMPLANT
BANDAGE ESMARK 6X9 LF (GAUZE/BANDAGES/DRESSINGS) ×1 IMPLANT
BIT DRILL 2.5X2.75 QC CALB (BIT) ×2 IMPLANT
BNDG CMPR 9X6 STRL LF SNTH (GAUZE/BANDAGES/DRESSINGS) ×1
BNDG COHESIVE 6X5 TAN STRL LF (GAUZE/BANDAGES/DRESSINGS) ×4 IMPLANT
BNDG ESMARK 6X9 LF (GAUZE/BANDAGES/DRESSINGS) ×2
BNDG GAUZE STRTCH 6 (GAUZE/BANDAGES/DRESSINGS) ×6 IMPLANT
CLOTH BEACON ORANGE TIMEOUT ST (SAFETY) ×2 IMPLANT
COTTON STERILE ROLL (GAUZE/BANDAGES/DRESSINGS) ×2 IMPLANT
COVER SURGICAL LIGHT HANDLE (MISCELLANEOUS) ×2 IMPLANT
CUFF TOURNIQUET SINGLE 34IN LL (TOURNIQUET CUFF) ×2 IMPLANT
CUFF TOURNIQUET SINGLE 44IN (TOURNIQUET CUFF) IMPLANT
DRAPE INCISE IOBAN 66X45 STRL (DRAPES) ×2 IMPLANT
DRAPE OEC MINIVIEW 54X84 (DRAPES) ×2 IMPLANT
DRAPE PROXIMA HALF (DRAPES) ×2 IMPLANT
DRAPE U-SHAPE 47X51 STRL (DRAPES) ×2 IMPLANT
DRSG ADAPTIC 3X8 NADH LF (GAUZE/BANDAGES/DRESSINGS) ×2 IMPLANT
DURAPREP 26ML APPLICATOR (WOUND CARE) ×2 IMPLANT
ELECT REM PT RETURN 9FT ADLT (ELECTROSURGICAL) ×2
ELECTRODE REM PT RTRN 9FT ADLT (ELECTROSURGICAL) ×1 IMPLANT
GLOVE BIOGEL PI IND STRL 9 (GLOVE) ×1 IMPLANT
GLOVE BIOGEL PI INDICATOR 9 (GLOVE) ×1
GLOVE SURG ORTHO 9.0 STRL STRW (GLOVE) ×2 IMPLANT
GOWN PREVENTION PLUS XLARGE (GOWN DISPOSABLE) ×2 IMPLANT
GOWN SRG XL XLNG 56XLVL 4 (GOWN DISPOSABLE) ×2 IMPLANT
GOWN STRL NON-REIN XL XLG LVL4 (GOWN DISPOSABLE) ×4
K-WIRE ACE 1.6X6 (WIRE) ×4
KIT BASIN OR (CUSTOM PROCEDURE TRAY) ×2 IMPLANT
KIT ROOM TURNOVER OR (KITS) ×2 IMPLANT
KWIRE ACE 1.6X6 (WIRE) ×2 IMPLANT
MANIFOLD NEPTUNE II (INSTRUMENTS) ×2 IMPLANT
NS IRRIG 1000ML POUR BTL (IV SOLUTION) ×2 IMPLANT
PACK ORTHO EXTREMITY (CUSTOM PROCEDURE TRAY) ×2 IMPLANT
PAD ARMBOARD 7.5X6 YLW CONV (MISCELLANEOUS) ×4 IMPLANT
PADDING CAST COTTON 6X4 STRL (CAST SUPPLIES) ×2 IMPLANT
PLATE LOCK SM LT CALC FOOT (Plate) ×2 IMPLANT
SCREW 3.5MM CORT LP 34MM (Screw) ×2 IMPLANT
SCREW CORT 3.5X32 (Screw) ×4 IMPLANT
SCREW CORT T15 28X3.5XST LCK (Screw) ×2 IMPLANT
SCREW CORT T15 32X3.5XST LCK (Screw) ×2 IMPLANT
SCREW CORTICAL 3.5X28MM (Screw) ×2 IMPLANT
SCREW LOCK CORT STAR 3.5X30 (Screw) ×4 IMPLANT
SCREW LOW PROFILE 3.5MMX42 (Screw) ×6 IMPLANT
SPONGE GAUZE 4X4 12PLY (GAUZE/BANDAGES/DRESSINGS) ×2 IMPLANT
SPONGE LAP 18X18 X RAY DECT (DISPOSABLE) ×2 IMPLANT
STAPLER VISISTAT 35W (STAPLE) IMPLANT
SUCTION FRAZIER TIP 10 FR DISP (SUCTIONS) ×2 IMPLANT
SUT ETHILON 2 0 PSLX (SUTURE) ×4 IMPLANT
SUT VIC AB 2-0 CTB1 (SUTURE) ×4 IMPLANT
TOWEL OR 17X24 6PK STRL BLUE (TOWEL DISPOSABLE) ×2 IMPLANT
TOWEL OR 17X26 10 PK STRL BLUE (TOWEL DISPOSABLE) ×2 IMPLANT
TUBE CONNECTING 12X1/4 (SUCTIONS) ×2 IMPLANT
WATER STERILE IRR 1000ML POUR (IV SOLUTION) ×2 IMPLANT

## 2011-11-25 NOTE — Anesthesia Postprocedure Evaluation (Signed)
  Anesthesia Post-op Note  Patient: Tristan Figueroa  Procedure(s) Performed: Procedure(s) (LRB): OPEN REDUCTION INTERNAL FIXATION (ORIF) CALCANEOUS FRACTURE (Left)  Patient Location: PACU  Anesthesia Type: General  Level of Consciousness: awake  Airway and Oxygen Therapy: Patient Spontanous Breathing  Post-op Pain: mild  Post-op Assessment: Post-op Vital signs reviewed  Post-op Vital Signs: Reviewed  Complications: No apparent anesthesia complications

## 2011-11-25 NOTE — Transfer of Care (Signed)
Immediate Anesthesia Transfer of Care Note  Patient: Tristan Figueroa  Procedure(s) Performed: Procedure(s) (LRB): OPEN REDUCTION INTERNAL FIXATION (ORIF) CALCANEOUS FRACTURE (Left)  Patient Location: PACU  Anesthesia Type: General  Level of Consciousness: awake, sedated and patient cooperative  Airway & Oxygen Therapy: Patient Spontanous Breathing and Patient connected to nasal cannula oxygen  Post-op Assessment: Report given to PACU RN, Post -op Vital signs reviewed and stable and Patient moving all extremities  Post vital signs: Reviewed and stable  Complications: No apparent anesthesia complications

## 2011-11-25 NOTE — Progress Notes (Signed)
ANTICOAGULATION CONSULT NOTE - Initial Consult  Pharmacy Consult for Coumadin Indication: VTE prophylaxis  No Known Allergies  Patient Measurements: Height: 5' 7.5" (171.5 cm) (pt. stated) Weight: 168 lb 6.9 oz (76.4 kg) IBW/kg (Calculated) : 67.25   Labs:  Basename 11/25/11 0605 11/24/11 0440 11/23/11 2351  HGB 11.3* 11.0* --  HCT 34.6* 33.6* 34.0*  PLT 172 193 202  APTT -- -- --  LABPROT -- -- --  INR -- -- --  HEPARINUNFRC -- -- --  CREATININE 0.86 0.79 0.79  CKTOTAL -- -- --  CKMB -- -- --  TROPONINI -- -- --    Estimated Creatinine Clearance: 91.3 ml/min (by C-G formula based on Cr of 0.86).   Medical History: Past Medical History  Diagnosis Date  . Hypertension   . Hyperlipidemia   . Diabetes mellitus     type 2   Assessment: 56 year old weighing 76 kg beginning Coumadin for VTE prophylaxis after fixation of calcaneous fracture (jumped from scaffolding approx 5 feet in elevation)  No Coumadin PTA or history of liver disease or alcohol use  Goal of Therapy:  INR 2-3 Monitor platelets by anticoagulation protocol: Yes   Plan:  1) Coumadin 5 mg po daily  2) Daily INR 3) Coumadin education  Thank you. Okey Regal, PharmD [phone]  11/25/2011,8:50 PM

## 2011-11-25 NOTE — Op Note (Signed)
OPERATIVE REPORT  DATE OF SURGERY: 11/25/2011  PATIENT:  Tristan Figueroa,  56 y.o. male  PRE-OPERATIVE DIAGNOSIS:  Left Calcaneous Fracture Sanders three-part posterior facet  POST-OPERATIVE DIAGNOSIS:  Left Calcaneous Fracture Sanders three-part posterior facet  PROCEDURE:  Procedure(s): OPEN REDUCTION INTERNAL FIXATION (ORIF) CALCANEOUS FRACTURE Biomet plate  SURGEON:  Surgeon(s): Nadara Mustard, MD  ANESTHESIA:   general  EBL:  Minimal ML  SPECIMEN:  No Specimen  TOURNIQUET:  * No tourniquets in log *  PROCEDURE DETAILS: Patient is a 56 year old gentleman who jumped from a height sustained bilateral calcaneal fractures. He had a severely comminuted left calcaneal fracture and a nondisplaced right calcaneal fracture he presents at this time for open reduction internal fixation. Discussed the patient is increased risk for complications do to his age greater than 80 smoker diabetic hypertension. Risks including infection neurovascular injury nonhealing wound potential for amputation. Patient states he understands and wished to proceed at this time. Should procedure patient was brought to the OR and underwent a general anesthetic. After adequate levels of anesthesia were obtained patient's left lower extremity was prepped using DuraPrep draped into a sterile field the patient allowed position with Ioban covering all exposed skin. An extensile lateral incision was made this was carried down to the calcaneus and subperiosteal dissection was used to elevate the flap. The K wires were placed into the fibula and talus to elevate the flap no tension was placed on the flap. The lateral wall was removed elevated the medial fragments were reduced there was multiple articular fragments within the joint. This was irrigated cleansed the posterior facet was reduced a plate was applied and secured with both locking and nonlocking screws. C-arm fluoroscopy verified reduction in both the lateral and axial  view. The wound is irrigated with normal saline subcutaneous is closed into an Vicryl the skin was closing a 2-0 nylon with a Algower Donati suture technique. The wound is covered with Adaptic orthopedic sponges AB dressing Kerlix and Coban patient was extubated taken to the PACU in stable condition  PLAN OF CARE: Admit to inpatient   PATIENT DISPOSITION:  PACU - hemodynamically stable.   Nadara Mustard, MD 11/25/2011 7:40 PM

## 2011-11-25 NOTE — Interval H&P Note (Signed)
History and Physical Interval Note:  11/25/2011 7:24 AM  Tristan Figueroa  has presented today for surgery, with the diagnosis of Left Calcaneous Fracture  The various methods of treatment have been discussed with the patient and family. After consideration of risks, benefits and other options for treatment, the patient has consented to  Procedure(s) (LRB): OPEN REDUCTION INTERNAL FIXATION (ORIF) CALCANEOUS FRACTURE (Left) as a surgical intervention .  The patient's history has been reviewed, patient examined, no change in status, stable for surgery.  I have reviewed the patient's chart and labs.  Questions were answered to the patient's satisfaction.     Kamryn Messineo V

## 2011-11-25 NOTE — Progress Notes (Signed)
Orthopedic Tech Progress Note Patient Details:  Tristan Figueroa 02/03/1956 [id number]  Ortho Devices Type of Ortho Device: Postop boot Splint Material: Fiberglass Ortho Device/Splint Location: bilateral Ortho Device/Splint Interventions: Casandra Doffing 11/25/2011, 9:17 PM

## 2011-11-25 NOTE — Anesthesia Preprocedure Evaluation (Signed)
Anesthesia Evaluation  Patient identified by MRN, date of birth, ID band Patient awake    Reviewed: Allergy & Precautions, H&P , NPO status , Patient's Chart, lab work & pertinent test results  History of Anesthesia Complications Negative for: history of anesthetic complications  Airway       Dental   Pulmonary former smoker,  Just quit last week          Cardiovascular Exercise Tolerance: Good hypertension, Pt. on medications     Neuro/Psych negative psych ROS   GI/Hepatic negative GI ROS, Neg liver ROS,   Endo/Other  Well Controlled, Type 2, Oral Hypoglycemic Agents  Renal/GU negative Renal ROS     Musculoskeletal   Abdominal   Peds  Hematology   Anesthesia Other Findings   Reproductive/Obstetrics                           Anesthesia Physical Anesthesia Plan  ASA: III  Anesthesia Plan: General   Post-op Pain Management:    Induction: Intravenous  Airway Management Planned: Oral ETT  Additional Equipment:   Intra-op Plan:   Post-operative Plan: Extubation in OR  Informed Consent: I have reviewed the patients History and Physical, chart, labs and discussed the procedure including the risks, benefits and alternatives for the proposed anesthesia with the patient or authorized representative who has indicated his/her understanding and acceptance.   Dental advisory given  Plan Discussed with: CRNA and Anesthesiologist  Anesthesia Plan Comments:         Anesthesia Quick Evaluation

## 2011-11-26 ENCOUNTER — Encounter (HOSPITAL_COMMUNITY): Payer: Self-pay | Admitting: Orthopedic Surgery

## 2011-11-26 LAB — PROTIME-INR
INR: 1.42 (ref 0.00–1.49)
Prothrombin Time: 17.6 seconds — ABNORMAL HIGH (ref 11.6–15.2)

## 2011-11-26 LAB — GLUCOSE, CAPILLARY
Glucose-Capillary: 121 mg/dL — ABNORMAL HIGH (ref 70–99)
Glucose-Capillary: 136 mg/dL — ABNORMAL HIGH (ref 70–99)

## 2011-11-26 MED ORDER — METFORMIN HCL 500 MG PO TABS
1000.0000 mg | ORAL_TABLET | Freq: Every day | ORAL | Status: DC
  Administered 2011-11-27 – 2011-11-30 (×4): 1000 mg via ORAL
  Filled 2011-11-26 (×6): qty 2

## 2011-11-26 MED ORDER — METFORMIN HCL 500 MG PO TABS
1000.0000 mg | ORAL_TABLET | Freq: Every day | ORAL | Status: DC
  Administered 2011-11-26: 1000 mg via ORAL
  Filled 2011-11-26: qty 2

## 2011-11-26 MED ORDER — DOCUSATE SODIUM 100 MG PO CAPS
100.0000 mg | ORAL_CAPSULE | Freq: Two times a day (BID) | ORAL | Status: DC
  Administered 2011-11-26 – 2011-12-01 (×3): 100 mg via ORAL
  Filled 2011-11-26 (×11): qty 1

## 2011-11-26 NOTE — Progress Notes (Signed)
Patient ID: Tristan Figueroa, male   DOB: [birth year], 56 y.o.   MRN: [id number] Postoperative day 1 open reduction internal fixation Sanders three-part calcaneal fracture on the left. Closed treatment for right calcaneal fracture. Patient will need discharge to short-term skilled nursing. Start transfer training today pivot on the right nonweightbearing on the left

## 2011-11-26 NOTE — Progress Notes (Signed)
ANTICOAGULATION CONSULT NOTE - Follow Up Consult  Pharmacy Consult for Coumadin Indication: VTE prophylaxis  No Known Allergies  Patient Measurements: Height: 5' 7.5" (171.5 cm) (pt. stated) Weight: 168 lb 6.9 oz (76.4 kg) IBW/kg (Calculated) : 67.25  Heparin Dosing Weight:    Vital Signs: Temp: 98.6 F (37 C) (08/29 0624) BP: 140/64 mmHg (08/29 0624) Pulse Rate: 95  (08/29 0624)  Labs:  Basename 11/26/11 0542 11/25/11 0605 11/24/11 0440 11/23/11 2351  HGB -- 11.3* 11.0* --  HCT -- 34.6* 33.6* 34.0*  PLT -- 172 193 202  APTT -- -- -- --  LABPROT 17.6* -- -- --  INR 1.42 -- -- --  HEPARINUNFRC -- -- -- --  CREATININE -- 0.86 0.79 0.79  CKTOTAL -- -- -- --  CKMB -- -- -- --  TROPONINI -- -- -- --    Estimated Creatinine Clearance: 91.3 ml/min (by C-G formula based on Cr of 0.86).  Assessment: VTE prophx after ORIF of calcaneous fracture (jumped from scaffolding approx 5 feet in elevation). INR 1.42. No baseline INR done.  Infectious Disease: Ancef x 3 doses. Afebrile. Cardiovascular: HTN, Hyperlipidemia, 140/64,95. Endo: uncontrolled DM Nephrology: Scr 0.86 Hematology / Oncology: No new CBC 8/29 PTA Medication Issues: Not addressed, sticky note left  Goal of Therapy:  INR 2-3 Monitor platelets by anticoagulation protocol: Yes   Plan:  Will continue Coumadin 5mg  daily for now.  Merilynn Finland, [employer] 11/26/2011,9:50 AM

## 2011-11-26 NOTE — Evaluation (Signed)
Physical Therapy Evaluation Patient Details Name: Tristan Figueroa MRN: [id number] DOB: 1955-08-25 Today's Date: 11/26/2011 Time: 7829-5621 PT Time Calculation (min): 19 min  PT Assessment / Plan / Recommendation Clinical Impression  Pt is a 56 y/o male with bilateral calcaneal fractures secondary to fall from scaffold at work. Pt will benefit from acute PT intervention to maximize funcitonal potential.       PT Assessment  Patient needs continued PT services    Follow Up Recommendations  Inpatient Rehab    Barriers to Discharge Decreased caregiver support      Equipment Recommendations  Wheelchair (measurements);3 in 1 bedside comode;Tub/shower bench    Recommendations for Other Services Rehab consult   Frequency Min 5X/week    Precautions / Restrictions Precautions Precautions: Fall Required Braces or Orthoses: Other Brace/Splint Other Brace/Splint: Splints on bilateral feet/ankles.  Restrictions Weight Bearing Restrictions: Yes RLE Weight Bearing: Non weight bearing LLE Weight Bearing: Partial weight bearing LLE Partial Weight Bearing Percentage or Pounds: wt bearing for transfers only Other Position/Activity Restrictions: Elevate bilateral LEs.    Pertinent Vitals/Pain Pt c/o 5/10 pain in L medial foot. RN notified.       Mobility  Bed Mobility Bed Mobility: Rolling Right;Rolling Left;Supine to Sit;Sitting - Scoot to Edge of Bed Rolling Right: 5: Supervision Rolling Left: 5: Supervision Supine to Sit: 5: Supervision Sitting - Scoot to Edge of Bed: 4: Min guard Details for Bed Mobility Assistance: Instructed pt in proper technique.   Transfers Transfers: Risk manager: 4: Min assist;To lower surface Details for Transfer Assistance: Cueing for technique, assist for bilateral LEs.  Ambulation/Gait Ambulation/Gait Assistance: Not tested (comment) Stairs: No Wheelchair Mobility Wheelchair Mobility: No Modified Rankin  (Stroke Patients Only) Pre-Morbid Rankin Score: No symptoms Modified Rankin: No symptoms    Exercises     PT Diagnosis: Difficulty walking;Generalized weakness  PT Problem List: Decreased activity tolerance;Decreased mobility;Pain PT Treatment Interventions: Gait training;Functional mobility training;Therapeutic activities;Patient/family education;Wheelchair mobility training   PT Goals Acute Rehab PT Goals PT Goal Formulation: With patient/family Time For Goal Achievement: 12/03/11 Potential to Achieve Goals: Good Pt will Transfer Bed to Chair/Chair to Bed: with modified independence PT Transfer Goal: Bed to Chair/Chair to Bed - Progress: Goal set today Pt will Propel Wheelchair: > 150 feet;Independently PT Goal: Propel Wheelchair - Progress: Goal set today  Visit Information  Last PT Received On: 11/26/11 Assistance Needed: +1    Subjective Data  Subjective: I feel alight Patient Stated Goal: Eventually return to work.   Prior Functioning  Home Living Lives With: Spouse;Family (8 y/o and 3 month) Available Help at Discharge: Family Type of Home: House Home Access: Stairs to enter Secretary/administrator of Steps: 4 Entrance Stairs-Rails: Right Home Layout: One level Bathroom Shower/Tub: Walk-in Contractor: Standard Home Adaptive Equipment: Built-in shower seat Prior Function Level of Independence: Independent Driving: Yes Vocation: Full time employment Communication Communication: No difficulties Dominant Hand: Right    Cognition  Overall Cognitive Status: Appears within functional limits for tasks assessed/performed Arousal/Alertness: Awake/alert Orientation Level: Appears intact for tasks assessed Behavior During Session: [facility] for tasks performed    Extremity/Trunk Assessment Right Upper Extremity Assessment RUE ROM/Strength/Tone: Within functional levels Left Upper Extremity Assessment LUE ROM/Strength/Tone: Within functional  levels Right Lower Extremity Assessment RLE ROM/Strength/Tone: Within functional levels;Unable to fully assess (Unable to assess feet and ankles. ) Left Lower Extremity Assessment LLE ROM/Strength/Tone: Within functional levels;Unable to fully assess (Unable to assess feet and ankles. ) Trunk Assessment Trunk Assessment: Normal  Balance Balance Balance Assessed: Yes Static Sitting Balance Static Sitting - Balance Support: Feet supported Static Sitting - Level of Assistance: 7: Independent  End of Session PT - End of Session Activity Tolerance: Patient tolerated treatment well Patient left: in chair;with call bell/phone within reach;with family/visitor present Nurse Communication: Mobility status  GP     Everly Rubalcava 11/26/2011, 12:37 PM  Haru Shaff L. Azaylah Stailey DPT [phone]

## 2011-11-26 NOTE — Progress Notes (Signed)
CARE MANAGEMENT NOTE 11/26/2011  Patient:  Tristan Figueroa, Tristan Figueroa   Account Number:  [id number]  Date Initiated:  11/25/2011  Documentation initiated by:  Vance Peper  Subjective/Objective Assessment:   56 yr old male adm for left calcaneous fracture     Action/Plan:   11/25/11- pt had Left calcaneous ORIF, Right closed treatment for fracture   Anticipated DC Date:     Anticipated DC Plan:  SKILLED NURSING FACILITY  In-house referral  Clinical Social Worker      DC Planning Services  CM consult      Choice offered to / List presented to:             Status of service:  In process, will continue to follow Medicare Important Message given?   (If response is "NO", the following Medicare IM given date fields will be blank) Date Medicare IM given:   Date Additional Medicare IM given:    Discharge Disposition:    Per UR Regulation:    If discussed at Long Length of Stay Meetings, dates discussed:    Comments:  11/26/11 1141 Vance Peper, RN BSN Case Manager Adjuster is [employer]. Nurse Case Manager is Jerilee Field with [facility]-[phone]. Clydie Braun will meet with patient, CM and Social worker today to discuss SNF possibilities.   11/25/11 1200 Vance Peper, RN BSN Case Manager [phone] Patient is under worker's comp. [phone] ext [phone]. Employer is [employer], [place]: [employer] [phone]

## 2011-11-27 LAB — GLUCOSE, CAPILLARY
Glucose-Capillary: 189 mg/dL — ABNORMAL HIGH (ref 70–99)
Glucose-Capillary: 202 mg/dL — ABNORMAL HIGH (ref 70–99)
Glucose-Capillary: 168 mg/dL — ABNORMAL HIGH (ref 70–99)

## 2011-11-27 LAB — PROTIME-INR: INR: 1.35 (ref 0.00–1.49)

## 2011-11-27 MED ORDER — HYDROCODONE-ACETAMINOPHEN 5-500 MG PO TABS: 1.0000 | ORAL_TABLET | Freq: Four times a day (QID) | ORAL | Status: AC | PRN

## 2011-11-27 MED ORDER — ATORVASTATIN CALCIUM 10 MG PO TABS
10.0000 mg | ORAL_TABLET | Freq: Every day | ORAL | Status: DC
  Administered 2011-11-27 – 2011-11-30 (×4): 10 mg via ORAL
  Filled 2011-11-27 (×5): qty 1

## 2011-11-27 MED ORDER — ASPIRIN EC 81 MG PO TBEC
81.0000 mg | DELAYED_RELEASE_TABLET | Freq: Every day | ORAL | Status: DC
  Administered 2011-11-27 – 2011-12-01 (×5): 81 mg via ORAL
  Filled 2011-11-27 (×7): qty 1

## 2011-11-27 MED ORDER — OXYCODONE-ACETAMINOPHEN 5-325 MG PO TABS: 1.0000 | ORAL_TABLET | ORAL | Status: AC | PRN

## 2011-11-27 MED ORDER — LOSARTAN POTASSIUM 25 MG PO TABS
25.0000 mg | ORAL_TABLET | Freq: Every day | ORAL | Status: DC
  Administered 2011-11-27 – 2011-12-01 (×5): 25 mg via ORAL
  Filled 2011-11-27 (×7): qty 1

## 2011-11-27 NOTE — Progress Notes (Signed)
Patient ID: Tristan Figueroa, male   DOB: 08/15/1955, 56 y.o.   MRN: [id number] Postoperative day 2 open reduction internal fixation left calcaneus fracture and closed treatment for right calcaneus fracture. Patient is ready for discharge to skilled nursing at this time. Discharge summary dictated prescriptions on the chart.

## 2011-11-27 NOTE — Progress Notes (Signed)
Physical Therapy Treatment Patient Details Name: Tristan Figueroa MRN: [id number] DOB: December 31, 1955 Today's Date: 11/27/2011 Time: 4098-1191 PT Time Calculation (min): 24 min  PT Assessment / Plan / Recommendation Comments on Treatment Session  Pt progressing nicely .     Follow Up Recommendations  Inpatient Rehab    Barriers to Discharge        Equipment Recommendations  Wheelchair (measurements);3 in 1 bedside comode;Tub/shower bench    Recommendations for Other Services Rehab consult  Frequency Min 5X/week   Plan Discharge plan remains appropriate;Frequency remains appropriate    Precautions / Restrictions Precautions Precautions: Fall Required Braces or Orthoses: Other Brace/Splint Other Brace/Splint: Splints on bilateral feet/ankles.  Restrictions Weight Bearing Restrictions: Yes RLE Weight Bearing: Partial weight bearing LLE Weight Bearing: Non weight bearing LLE Partial Weight Bearing Percentage or Pounds: wt bearing for transfers only Other Position/Activity Restrictions: Elevate bilateral LEs.    Pertinent Vitals/Pain Pt reports pain 3/10 in L LE.   No pain in R LE.      Mobility  Bed Mobility Bed Mobility: Not assessed Transfers Transfers: Squat Pivot Transfers Squat Pivot Transfers: 5: Supervision;With upper extremity assistance (2 trials.  ) Anterior-Posterior Transfer: Not tested (comment) Details for Transfer Assistance: Instructed in proper technique pt able to perform with minmal wt on R foot and no weight on L LE.   Ambulation/Gait Ambulation/Gait Assistance: Not tested (comment) Wheelchair Mobility Wheelchair Mobility: Yes Wheelchair Assistance: 5: Supervision;6: Modified independent (Device/Increase time) Wheelchair Assistance Details (indicate cue type and reason): Instructed pt in proper use of wheelchair.   Wheelchair Propulsion: Both upper extremities Wheelchair Parts Management: Supervision/cueing Distance: 200    Exercises     PT Diagnosis:     PT Problem List:   PT Treatment Interventions:     PT Goals Acute Rehab PT Goals PT Goal Formulation: With patient/family Time For Goal Achievement: 12/03/11 Potential to Achieve Goals: Good Pt will Transfer Bed to Chair/Chair to Bed: with modified independence PT Transfer Goal: Bed to Chair/Chair to Bed - Progress: Progressing toward goal Pt will Propel Wheelchair: > 150 feet;Independently PT Goal: Propel Wheelchair - Progress: Met  Visit Information  Last PT Received On: 11/27/11 Assistance Needed: +1    Subjective Data  Subjective: I dont think I can put any weight on my Right foot.   Patient Stated Goal: Eventually return to work.   Cognition  Overall Cognitive Status: Appears within functional limits for tasks assessed/performed Arousal/Alertness: Awake/alert Orientation Level: Appears intact for tasks assessed Behavior During Session: [facility] for tasks performed    Balance  Balance Balance Assessed: No  End of Session PT - End of Session Equipment Utilized During Treatment: Gait belt Activity Tolerance: Patient tolerated treatment well Patient left: in chair;with call bell/phone within reach;with family/visitor present Nurse Communication: Mobility status   GP     Yu Cragun 11/27/2011, 2:08 PM Darrel Baroni L. Jamirah Zelaya DPT [phone]

## 2011-11-27 NOTE — Progress Notes (Signed)
ANTICOAGULATION CONSULT NOTE - Follow Up Consult  Pharmacy Consult for Coumadin Indication: VTE prophylaxis  No Known Allergies  Patient Measurements: Height: 5' 7.5" (171.5 cm) (pt. stated) Weight: 168 lb 6.9 oz (76.4 kg) IBW/kg (Calculated) : 67.25    Vital Signs: Temp: 98.2 F (36.8 C) (08/30 0828) BP: 146/64 mmHg (08/30 0828) Pulse Rate: 99  (08/30 0828)  Labs:  Basename 11/27/11 0630 11/26/11 0542 11/25/11 0605  HGB -- -- 11.3*  HCT -- -- 34.6*  PLT -- -- 172  APTT -- -- --  LABPROT 16.9* 17.6* --  INR 1.35 1.42 --  HEPARINUNFRC -- -- --  CREATININE -- -- 0.86  CKTOTAL -- -- --  CKMB -- -- --  TROPONINI -- -- --    Estimated Creatinine Clearance: 91.3 ml/min (by C-G formula based on Cr of 0.86).  Assessment: VTE prophx after ORIF of calcaneous fracture (jumped from scaffolding approx 5 feet in elevation). INR 1.35 after 2 doses of coumadin 5 mg given. No baseline INR done. Orders written to DC to SNF with no coumadin ordered.   Goal of Therapy:  INR 2-3   Plan:  1. Continue coumadin 5 mg po daily while in [facility] 2. I re-ordered his home meds of aspirin 81mg , atorvastatin and losartan since they were ordered for the SNF. Herby Abraham, Pharm.D. [phone] 11/27/2011 11:56 AM

## 2011-11-27 NOTE — Discharge Summary (Signed)
Physician Discharge Summary  Patient ID: Tristan Figueroa MRN: [id number] DOB/AGE: 1956/01/31 56 y.o.  Admit date: 11/23/2011 Discharge date: 11/27/2011  Admission Diagnoses: Bilateral calcaneal fractures  Discharge Diagnoses: Same Principal Problem:  *Bilateral calcaneal fractures   Discharged Condition: stable  Hospital Course: Patient's hospital course was essentially unremarkable. He underwent open reduction internal fixation of the left calcaneal fracture. Right calcaneal fracture was nondisplaced. Patient was discharged to skilled nursing in stable condition nonweightbearing on the left pivot weightbearing on the right.  Consults: None  Significant Diagnostic Studies: labs: Routine labs  Treatments: surgery: Please see operative note  Discharge Exam: Blood pressure 153/70, pulse 103, temperature 99.1 F (37.3 C), temperature source Oral, resp. rate 18, height 5' 7.5" (1.715 m), weight 76.4 kg (168 lb 6.9 oz), SpO2 98.00%. Incision/Wound: incision and dressing clean and dry at time of discharge  Disposition: Final discharge disposition not confirmed  Discharge Orders    Future Orders Please Complete By Expires   Diet - low sodium heart healthy      Call MD / Call 911      Comments:   If you experience chest pain or shortness of breath, CALL 911 and be transported to the hospital emergency room.  If you develope a fever above 101 F, pus (white drainage) or increased drainage or redness at the wound, or calf pain, call your surgeon's office.   Constipation Prevention      Comments:   Drink plenty of fluids.  Prune juice may be helpful.  You may use a stool softener, such as Colace (over the counter) 100 mg twice a day.  Use MiraLax (over the counter) for constipation as needed.   Increase activity slowly as tolerated      Discharge instructions      Comments:   Physical therapy transfer training only. Pivot on right lower extremity for transfers nonweightbearing left lower  extremity. Change dressing left lower extremity as needed. Right lower extremity dressing is only a bulky dressing.     Medication List  As of 11/27/2011  6:34 AM   TAKE these medications         acetaminophen 500 MG tablet   Commonly known as: TYLENOL   Take 1,000 mg by mouth every 6 (six) hours as needed. For pain.      aspirin EC 81 MG tablet   Take 81 mg by mouth daily.      atorvastatin 10 MG tablet   Commonly known as: LIPITOR   Take 1 tablet (10 mg total) by mouth daily.      HYDROcodone-acetaminophen 5-500 MG per tablet   Commonly known as: VICODIN   Take 1 tablet by mouth every 6 (six) hours as needed for pain.      losartan 25 MG tablet   Commonly known as: COZAAR   Take 1 tablet (25 mg total) by mouth daily.      metFORMIN 1000 MG tablet   Commonly known as: GLUCOPHAGE   Take 1,000 mg by mouth daily.      oxyCODONE-acetaminophen 5-325 MG per tablet   Commonly known as: PERCOCET/ROXICET   Take 1 tablet by mouth every 4 (four) hours as needed for pain.           Follow-up Information    Follow up with Makailey Hodgkin V, MD in 2 weeks.   Contact information:   [address] [place] [address] [phone]          Signed: Nadara Mustard 11/27/2011,  6:34 AM

## 2011-11-27 NOTE — Clinical Social Work Placement (Signed)
     Clinical Social Work Department CLINICAL SOCIAL WORK PLACEMENT NOTE 11/27/2011  Patient:  Tristan Figueroa, Tristan Figueroa  Account Number:  [id number] Admit date:  11/23/2011  Clinical Social Worker:  Lupita Leash Clarrisa Kaylor, BSW  Date/time:  11/27/2011 06:39 PM  Clinical Social Work is seeking post-discharge placement for this patient at the following level of care:   SKILLED NURSING   (*CSW will update this form in Epic as items are completed)   11/27/2011  Patient/family provided with [facility] Department of Clinical Social Works list of facilities offering this level of care within the geographic area requested by the patient (or if unable, by the patients family).  11/27/2011  Patient/family informed of their freedom to choose among providers that offer the needed level of care, that participate in Medicare, Medicaid or managed care program needed by the patient, have an available bed and are willing to accept the patient.  11/27/2011  Patient/family informed of [facility] ownership interest in [facility], as well as of the fact that they are under no obligation to receive care at this facility.  PASARR submitted to EDS on 11/27/2011 PASARR number received from EDS on 11/27/2011  FL2 transmitted to all facilities in geographic area requested by pt/family on  11/27/2011 FL2 transmitted to all facilities within larger geographic area on   Patient informed that his/her managed care company has contracts with or will negotiate with  certain facilities, including the following:   Worker's Compensation   CM is Jerilee Field  ([phone]     Patient/family informed of bed offers received:  11/27/2011 Patient chooses bed at  Physician recommends and patient chooses bed at    Patient to be transferred to  on   Patient to be transferred to facility by Ambulance  The following physician request were entered in Epic:   Additional Comments: [facility] is  attempting to negotiate a rate with worker's comp

## 2011-11-27 NOTE — Clinical Social Work Psychosocial (Signed)
     Clinical Social Work Department BRIEF PSYCHOSOCIAL ASSESSMENT 11/27/2011  Patient:  Tristan Figueroa, Tristan Figueroa     Account Number:  [id number]     Admit date:  11/23/2011  Clinical Social Worker:  Burnard Hawthorne  Date/Time:  11/27/2011 06:34 PM  Referred by:  Physician  Date Referred:  11/26/2011 Referred for  SNF Placement   Other Referral:   Interview type:  Patient Other interview type:    PSYCHOSOCIAL DATA Living Status:  WIFE Admitted from facility:   Level of care:   Primary support name:  [name] Primary support relationship to patient:  SPOUSE Degree of support available:   Strong support    CURRENT CONCERNS Current Concerns  Post-Acute Placement   Other Concerns:    SOCIAL WORK ASSESSMENT / PLAN Patient has been referred to CSW for short term SNF. Patient had orginaly planned d/c home with wife but will require more extensive therapy prior to home d/c.  Met with paitient- he is agreeable and bed search process discussed. Patient is under worker's compenstation coverage- thus- there will be negotiations with SNF for placement.  Fl2 was completed and sent to SNFs.  [facility] and are attempting to negotiate with insurance for placement   Assessment/plan status:  Psychosocial Support/Ongoing Assessment of Needs Other assessment/ plan:   Information/referral to community resources:   SNF bed list provided    PATIENTS/FAMILYS RESPONSE TO PLAN OF CARE: Patient is alert, oriented and very pleasant. He is willing to go to any SNF that will accept him and has a very positive attitude.  Patient is aware of need for insurance negotiation.

## 2011-11-28 LAB — PROTIME-INR: INR: 1.43 (ref 0.00–1.49)

## 2011-11-28 LAB — GLUCOSE, CAPILLARY: Glucose-Capillary: 149 mg/dL — ABNORMAL HIGH (ref 70–99)

## 2011-11-28 NOTE — Progress Notes (Signed)
Subjective: 3 Days Post-Op Procedure(s) (LRB): OPEN REDUCTION INTERNAL FIXATION (ORIF) CALCANEOUS FRACTURE (Left) Patient reports pain as mild.    Objective: Vital signs in last 24 hours: Temp:  [98.1 F (36.7 C)-99.2 F (37.3 C)] 98.8 F (37.1 C) (08/31 0617) Pulse Rate:  [85-99] 85  (08/31 0617) Resp:  [16-18] 18  (08/31 0617) BP: (134-147)/(63-89) 145/63 mmHg (08/31 0617) SpO2:  [97 %-100 %] 97 % (08/31 0617)  Intake/Output from previous day: 08/30 0701 - 08/31 0700 In: 390 [P.O.:390] Out: 1300 [Urine:1300] Intake/Output this shift:    No results found for this basename: HGB:5 in the last 72 hours No results found for this basename: WBC:2,RBC:2,HCT:2,PLT:2 in the last 72 hours No results found for this basename: NA:2,K:2,CL:2,CO2:2,BUN:2,CREATININE:2,GLUCOSE:2,CALCIUM:2 in the last 72 hours  Basename 11/28/11 0537 11/27/11 0630  LABPT -- --  INR 1.43 1.35    Neurologically intact  Assessment/Plan: 3 Days Post-Op Procedure(s) (LRB): OPEN REDUCTION INTERNAL FIXATION (ORIF) CALCANEOUS FRACTURE (Left) Discharge to SNF  Aayat Hajjar C 11/28/2011, 8:06 AM

## 2011-11-28 NOTE — Progress Notes (Signed)
Per Justin Mend, admissions director at [facility], no worker Lafe Garin for SNF received as of today. Anticipate auth for SNF Monday or Tues. CSW will continue to follow and assist with SNF dispo.  Dellie Burns, MSW, LCSWA [phone] (Weekends 8:00am-4:30pm)

## 2011-11-28 NOTE — Progress Notes (Signed)
ANTICOAGULATION CONSULT NOTE - Follow Up Consult  Pharmacy Consult for Coumadin Indication: VTE prophylaxis  No Known Allergies  Patient Measurements: Height: 5' 7.5" (171.5 cm) (pt. stated) Weight: 168 lb 6.9 oz (76.4 kg) IBW/kg (Calculated) : 67.25    Vital Signs: Temp: 98.8 F (37.1 C) (08/31 0617) BP: 145/63 mmHg (08/31 0617) Pulse Rate: 85  (08/31 0617)  Labs:  Basename 11/28/11 0537 11/27/11 0630 11/26/11 0542  HGB -- -- --  HCT -- -- --  PLT -- -- --  APTT -- -- --  LABPROT 17.7* 16.9* 17.6*  INR 1.43 1.35 1.42  HEPARINUNFRC -- -- --  CREATININE -- -- --  CKTOTAL -- -- --  CKMB -- -- --  TROPONINI -- -- --    Estimated Creatinine Clearance: 91.3 ml/min (by C-G formula based on Cr of 0.86).  Assessment: VTE prophx after ORIF of calcaneous fracture (jumped from scaffolding approx 5 feet in elevation). INR 1.43 after 3 doses of coumadin 5 mg given. No baseline INR done. Orders written to DC to SNF with no coumadin ordered. No bleeding reported.  Goal of Therapy:  INR 2-3   Plan:  1. Continue coumadin 5 mg po daily while in [facility] 2. Daily INR Herby Abraham, Pharm.D. [phone] 11/28/2011 9:07 AM

## 2011-11-29 LAB — PROTIME-INR
INR: 1.58 — ABNORMAL HIGH (ref 0.00–1.49)
Prothrombin Time: 19.2 seconds — ABNORMAL HIGH (ref 11.6–15.2)

## 2011-11-29 NOTE — Progress Notes (Signed)
ANTICOAGULATION CONSULT NOTE - Follow Up Consult  Pharmacy Consult for Coumadin Indication: VTE prophylaxis  No Known Allergies  Patient Measurements: Height: 5' 7.5" (171.5 cm) (pt. stated) Weight: 168 lb 6.9 oz (76.4 kg) IBW/kg (Calculated) : 67.25    Vital Signs: Temp: 98.1 F (36.7 C) (09/01 0630) BP: 133/71 mmHg (09/01 0630) Pulse Rate: 88  (09/01 0630)  Labs:  Basename 11/29/11 0620 11/28/11 0537 11/27/11 0630  HGB -- -- --  HCT -- -- --  PLT -- -- --  APTT -- -- --  LABPROT 19.2* 17.7* 16.9*  INR 1.58* 1.43 1.35  HEPARINUNFRC -- -- --  CREATININE -- -- --  CKTOTAL -- -- --  CKMB -- -- --  TROPONINI -- -- --    Estimated Creatinine Clearance: 91.3 ml/min (by C-G formula based on Cr of 0.86).  Assessment: VTE prophx after ORIF of calcaneous fracture (jumped from scaffolding approx 5 feet in elevation). INR 1.58  after 4 doses of coumadin 5 mg given. No baseline INR done. Orders written to DC to SNF with no coumadin ordered. No bleeding reported.  Goal of Therapy:  INR 2-3   Plan:  1. Continue coumadin 5 mg po daily while in [facility] 2. Daily INR Herby Abraham, Pharm.D. [phone] 11/29/2011 8:21 AM

## 2011-11-29 NOTE — Progress Notes (Signed)
Subjective: 4 Days Post-Op Procedure(s) (LRB): OPEN REDUCTION INTERNAL FIXATION (ORIF) CALCANEOUS FRACTURE (Left) Patient reports pain as mild.    Objective: Vital signs in last 24 hours: Temp:  [98.1 F (36.7 C)-98.8 F (37.1 C)] 98.1 F (36.7 C) (09/01 0630) Pulse Rate:  [74-88] 88  (09/01 0630) Resp:  [18] 18  (09/01 0630) BP: (133-141)/(64-71) 133/71 mmHg (09/01 0630) SpO2:  [97 %-100 %] 97 % (09/01 0630)  Intake/Output from previous day: 08/31 0701 - 09/01 0700 In: 480 [P.O.:480] Out: 700 [Urine:700] Intake/Output this shift:    No results found for this basename: HGB:5 in the last 72 hours No results found for this basename: WBC:2,RBC:2,HCT:2,PLT:2 in the last 72 hours No results found for this basename: NA:2,K:2,CL:2,CO2:2,BUN:2,CREATININE:2,GLUCOSE:2,CALCIUM:2 in the last 72 hours  Basename 11/29/11 0620 11/28/11 0537  LABPT -- --  INR 1.58* 1.43    Neurologically intact  Assessment/Plan: 4 Days Post-Op Procedure(s) (LRB): OPEN REDUCTION INTERNAL FIXATION (ORIF) CALCANEOUS FRACTURE (Left) Discharge to SNF  Catelynn Sparger C 11/29/2011, 10:06 AM

## 2011-11-30 NOTE — Progress Notes (Signed)
Orthopedic Tech Progress Note Patient Details:  Tristan Figueroa December 28, 1955 [id number]  Ortho Devices Type of Ortho Device: CAM walker Splint Material: Fiberglass Ortho Device/Splint Location: right cam walker Ortho Device/Splint Interventions: Application   Cammer, Mickie Bail 11/30/2011, 10:03 AM

## 2011-11-30 NOTE — Progress Notes (Signed)
Physical Therapy Treatment Patient Details Name: Tristan Figueroa MRN: [id number] DOB: 1955-10-18 Today's Date: 11/30/2011 Time: 5621-3086 PT Time Calculation (min): 12 min  PT Assessment / Plan / Recommendation Comments on Treatment Session       Follow Up Recommendations  Inpatient Rehab    Barriers to Discharge        Equipment Recommendations  Wheelchair (measurements);3 in 1 bedside comode;Tub/shower bench    Recommendations for Other Services Rehab consult  Frequency Min 3X/week   Plan Discharge plan remains appropriate;Frequency remains appropriate    Precautions / Restrictions Precautions Precautions: Fall Required Braces or Orthoses: Other Brace/Splint Other Brace/Splint: Splints on bilateral feet/ankles.  Cam Boot on R foot.  Restrictions Weight Bearing Restrictions: Yes RLE Weight Bearing: Partial weight bearing RLE Partial Weight Bearing Percentage or Pounds: for transfers only LLE Weight Bearing: Non weight bearing LLE Partial Weight Bearing Percentage or Pounds: wt bearing for transfers only Other Position/Activity Restrictions: Elevate bilateral LEs.    Pertinent Vitals/Pain Pain in bilateral feet. 5/10 No intervention required per pt.     Mobility  Bed Mobility Bed Mobility: Supine to Sit;Sit to Supine Supine to Sit: 5: Supervision Sit to Supine: 6: Modified independent (Device/Increase time) Details for Bed Mobility Assistance: Instructed pt in proper technique.   Transfers Transfers: Heritage manager Transfers: 5: Supervision;With upper extremity assistance Anterior-Posterior Transfer: Not tested (comment) Details for Transfer Assistance: Cueing to bear weight on R LE  Wheelchair Mobility Wheelchair Mobility: No    Exercises     PT Diagnosis:    PT Problem List:   PT Treatment Interventions:     PT Goals Acute Rehab PT Goals PT Goal Formulation: With patient/family Time For Goal Achievement: 12/03/11 Potential to Achieve  Goals: Good Pt will Transfer Bed to Chair/Chair to Bed: with modified independence PT Transfer Goal: Bed to Chair/Chair to Bed - Progress: Progressing toward goal Pt will Propel Wheelchair: > 150 feet;Independently  Visit Information  Last PT Received On: 11/30/11 Assistance Needed: +1    Subjective Data  Subjective: I can move my ankles but I still cant put any weight on them.  Patient Stated Goal: Eventually return to work.   Cognition  Overall Cognitive Status: Appears within functional limits for tasks assessed/performed Arousal/Alertness: Awake/alert Orientation Level: Appears intact for tasks assessed Behavior During Session: [facility] for tasks performed    Balance  Balance Balance Assessed: No  End of Session PT - End of Session Equipment Utilized During Treatment: Gait belt Activity Tolerance: Patient tolerated treatment well Patient left: in bed;with call bell/phone within reach;with family/visitor present (friend present) Nurse Communication: Mobility status   GP     Jeorgia Helming 11/30/2011, 12:34 PM Levina Boyack L. Leda Bellefeuille DPT [phone]

## 2011-11-30 NOTE — Progress Notes (Signed)
Patient ID: Tristan Figueroa, male   DOB: November 16, 1955, 56 y.o.   MRN: [id number] Postoperative day 3 status post open reduction internal fixation left calcaneus fracture. We will have the left dressing changed today placed the right foot in a fracture boot for the right calcaneus fracture. Patient's prescriptions and F. L2 are on the chart. Plan for discharge to short-term skilled nursing today or tomorrow. Awaiting authorization from [employer]

## 2011-11-30 NOTE — Progress Notes (Signed)
Anticipate completion of worker's comp authorization tomorrow for d/c to [employer]. MD:  Plan DC to SNF tomorrow if medically stable.  Thanks!  Lorri Frederick. [place]  [phone]

## 2011-11-30 NOTE — Progress Notes (Signed)
ANTICOAGULATION CONSULT NOTE - Follow Up Consult  Pharmacy Consult for Coumadin Indication: VTE prophylaxis  No Known Allergies  Patient Measurements: Height: 5' 7.5" (171.5 cm) (pt. stated) Weight: 168 lb 6.9 oz (76.4 kg) IBW/kg (Calculated) : 67.25    Vital Signs: Temp: 98.6 F (37 C) (09/02 0543) Temp src: Oral (09/02 0543) BP: 139/65 mmHg (09/02 0543) Pulse Rate: 80  (09/02 0543)  Labs:  Alvira Philips 11/30/11 0537 11/29/11 0620 11/28/11 0537  HGB -- -- --  HCT -- -- --  PLT -- -- --  APTT -- -- --  LABPROT 21.1* 19.2* 17.7*  INR 1.79* 1.58* 1.43  HEPARINUNFRC -- -- --  CREATININE -- -- --  CKTOTAL -- -- --  CKMB -- -- --  TROPONINI -- -- --    Estimated Creatinine Clearance: 91.3 ml/min (by C-G formula based on Cr of 0.86).  Assessment: VTE prophx after ORIF of calcaneous fracture (jumped from scaffolding approx 5 feet in elevation). INR 1.79 after 5 doses of coumadin 5 mg given. No baseline INR done. Orders written to DC to SNF with no coumadin ordered. No bleeding reported.  Goal of Therapy:  INR 2-3   Plan:  1. Continue coumadin 5 mg po daily while in [facility] 2. Daily INR Herby Abraham, Pharm.D. [phone] 11/30/2011 9:12 AM

## 2011-12-01 NOTE — Progress Notes (Signed)
Utilization review completed. Haddon Fyfe, RN, BSN. 

## 2011-12-01 NOTE — Discharge Summary (Signed)
Physician Discharge Summary  Patient ID: Tristan Figueroa MRN: [id number] DOB/AGE: 56-Aug-1957 56 y.o.  Admit date: 11/23/2011 Discharge date: 12/01/2011  Admission Diagnoses: Bilateral calcaneal fractures.  Discharge Diagnoses:  Principal Problem:  *Bilateral calcaneal fractures   Discharged Condition: stable  Hospital Course: Patient's hospital course was essentially unremarkable. He underwent open reduction internal fixation for the displaced left calcaneus fracture. The right calcaneus fracture was treated closed do to a congruent subtalar joint. Patient will require short-term skilled nursing do to his inability to weight-bear on either lower extremity. Plan for discharge to skilled nursing.  Consults: None  Significant Diagnostic Studies: labs: Routine labs  Treatments: surgery: Please see operative note  Discharge Exam: Blood pressure 135/64, pulse 73, temperature 98.4 F (36.9 C), temperature source Oral, resp. rate 17, height 5' 7.5" (1.715 m), weight 76.4 kg (168 lb 6.9 oz), SpO2 100.00%. Incision/Wound: incision clean and dry at time of discharge  Disposition: Final discharge disposition not confirmed  Discharge Orders    Future Orders Please Complete By Expires   Diet - low sodium heart healthy      Diet - low sodium heart healthy      Call MD / Call 911      Comments:   If you experience chest pain or shortness of breath, CALL 911 and be transported to the hospital emergency room.  If you develope a fever above 101 F, pus (white drainage) or increased drainage or redness at the wound, or calf pain, call your surgeon's office.   Constipation Prevention      Comments:   Drink plenty of fluids.  Prune juice may be helpful.  You may use a stool softener, such as Colace (over the counter) 100 mg twice a day.  Use MiraLax (over the counter) for constipation as needed.   Increase activity slowly as tolerated      Discharge instructions      Comments:   Physical therapy  transfer training only. Pivot on right lower extremity for transfers nonweightbearing left lower extremity. Change dressing left lower extremity as needed. Right lower extremity dressing is only a bulky dressing.   Call MD / Call 911      Comments:   If you experience chest pain or shortness of breath, CALL 911 and be transported to the hospital emergency room.  If you develope a fever above 101 F, pus (white drainage) or increased drainage or redness at the wound, or calf pain, call your surgeon's office.   Constipation Prevention      Comments:   Drink plenty of fluids.  Prune juice may be helpful.  You may use a stool softener, such as Colace (over the counter) 100 mg twice a day.  Use MiraLax (over the counter) for constipation as needed.   Increase activity slowly as tolerated        Medication List  As of 12/01/2011  6:37 AM   TAKE these medications         acetaminophen 500 MG tablet   Commonly known as: TYLENOL   Take 1,000 mg by mouth every 6 (six) hours as needed. For pain.      aspirin EC 81 MG tablet   Take 81 mg by mouth daily.      atorvastatin 10 MG tablet   Commonly known as: LIPITOR   Take 1 tablet (10 mg total) by mouth daily.      HYDROcodone-acetaminophen 5-500 MG per tablet   Commonly known as: VICODIN   Take 1  tablet by mouth every 6 (six) hours as needed for pain.      losartan 25 MG tablet   Commonly known as: COZAAR   Take 1 tablet (25 mg total) by mouth daily.      metFORMIN 1000 MG tablet   Commonly known as: GLUCOPHAGE   Take 1,000 mg by mouth daily.      oxyCODONE-acetaminophen 5-325 MG per tablet   Commonly known as: PERCOCET/ROXICET   Take 1 tablet by mouth every 4 (four) hours as needed for pain.           Follow-up Information    Follow up with Celester Morgan V, MD in 2 weeks.   Contact information:   [address] [place] [address] [phone]          Signed: Nadara Mustard 12/01/2011, 6:37 AM

## 2012-02-12 ENCOUNTER — Other Ambulatory Visit: Payer: Self-pay | Admitting: Internal Medicine

## 2012-02-12 MED ORDER — GLIMEPIRIDE 2 MG PO TABS: 2.0000 mg | ORAL_TABLET | Freq: Every day | ORAL | Status: DC

## 2012-02-12 NOTE — Telephone Encounter (Signed)
Pt has not been seen within a year. OK to refill? 

## 2012-02-12 NOTE — Telephone Encounter (Signed)
Refill done.  

## 2012-02-12 NOTE — Telephone Encounter (Signed)
Okay #30, no refills. No further refills without office visit.

## 2012-02-12 NOTE — Addendum Note (Signed)
Addended by: Edwena Felty T on: 02/12/2012 01:30 PM   Modules accepted: Orders

## 2012-02-15 ENCOUNTER — Telehealth: Payer: Self-pay

## 2012-02-15 MED ORDER — LOSARTAN POTASSIUM 25 MG PO TABS: 25.0000 mg | ORAL_TABLET | Freq: Every day | ORAL | Status: DC

## 2012-02-15 MED ORDER — GLIMEPIRIDE 2 MG PO TABS: 2.0000 mg | ORAL_TABLET | Freq: Every day | ORAL | Status: DC

## 2012-02-15 MED ORDER — ATORVASTATIN CALCIUM 10 MG PO TABS: 10.0000 mg | ORAL_TABLET | Freq: Every day | ORAL | Status: DC

## 2012-02-15 NOTE — Telephone Encounter (Signed)
Sent in rx's for 1 month. Left pt a msg that he needs to call & schedule an OV. No more refills without an OV.

## 2012-02-15 NOTE — Telephone Encounter (Signed)
Pt called [facility] triage line twice asking for refill. Pt didn't provide med name or CB #. I was able to find pt by last name. Pt requesting CB from [facility] nurse  PLz advise    MW

## 2012-02-15 NOTE — Telephone Encounter (Signed)
RN spoke with patient, he needs refills on his Glimepiride, Lipitor and Cozaar.  States that he has already called the pharmacy and they advised him to call the office personally.

## 2012-05-27 ENCOUNTER — Other Ambulatory Visit: Payer: Self-pay | Admitting: Internal Medicine

## 2012-05-30 ENCOUNTER — Other Ambulatory Visit: Payer: Self-pay | Admitting: Internal Medicine

## 2012-05-30 NOTE — Telephone Encounter (Signed)
Pt has future appt 3.7.14.  Refill done.

## 2012-05-30 NOTE — Telephone Encounter (Signed)
Pt has future appt 3.7.14.  Refill done.  

## 2012-06-03 ENCOUNTER — Ambulatory Visit: Payer: Self-pay | Admitting: Internal Medicine

## 2012-06-03 ENCOUNTER — Telehealth: Payer: Self-pay | Admitting: Internal Medicine

## 2012-06-03 MED ORDER — METFORMIN HCL 1000 MG PO TABS: 1000.0000 mg | ORAL_TABLET | Freq: Two times a day (BID) | ORAL | Status: DC

## 2012-06-03 NOTE — Telephone Encounter (Signed)
I called pt to cancel appt for today. He is rescheduled for Tuesday. He states he is out of Metformin and would like it sent to [facility] on [address]

## 2012-06-03 NOTE — Telephone Encounter (Signed)
Spoke w/ pt, Rx sent

## 2012-06-07 ENCOUNTER — Ambulatory Visit: Payer: Self-pay | Admitting: Internal Medicine

## 2012-06-24 ENCOUNTER — Ambulatory Visit: Payer: Self-pay | Admitting: Internal Medicine

## 2012-07-01 ENCOUNTER — Encounter: Payer: Self-pay | Admitting: Internal Medicine

## 2012-07-01 ENCOUNTER — Ambulatory Visit (INDEPENDENT_AMBULATORY_CARE_PROVIDER_SITE_OTHER): Payer: Self-pay | Admitting: Internal Medicine

## 2012-07-01 VITALS — BP 148/86 | HR 89 | Temp 98.3°F | Wt 177.0 lb

## 2012-07-01 DIAGNOSIS — Z0000 Encounter for general adult medical examination without abnormal findings: Secondary | ICD-10-CM

## 2012-07-01 DIAGNOSIS — I10 Essential (primary) hypertension: Secondary | ICD-10-CM

## 2012-07-01 DIAGNOSIS — E785 Hyperlipidemia, unspecified: Secondary | ICD-10-CM

## 2012-07-01 DIAGNOSIS — E119 Type 2 diabetes mellitus without complications: Secondary | ICD-10-CM

## 2012-07-01 DIAGNOSIS — F17200 Nicotine dependence, unspecified, uncomplicated: Secondary | ICD-10-CM

## 2012-07-01 LAB — COMPREHENSIVE METABOLIC PANEL
ALT: 16 U/L (ref 0–53)
Albumin: 3.8 g/dL (ref 3.5–5.2)
Alkaline Phosphatase: 68 U/L (ref 39–117)
CO2: 25 mEq/L (ref 19–32)
GFR: 138.05 mL/min (ref >60.00)
Glucose, Bld: 155 mg/dL — ABNORMAL HIGH (ref 70–99)
Potassium: 3.9 mEq/L (ref 3.5–5.1)
Sodium: 139 mEq/L (ref 135–145)
Total Bilirubin: 0.4 mg/dL (ref 0.3–1.2)
Total Protein: 7.4 g/dL (ref 6.0–8.3)

## 2012-07-01 LAB — CBC WITH DIFFERENTIAL/PLATELET
Basophils Absolute: 0 10*3/uL (ref 0.0–0.1)
Eosinophils Relative: 1.5 % (ref 0.0–5.0)
HCT: 38.6 % — ABNORMAL LOW (ref 39.0–52.0)
Lymphs Abs: 1.9 10*3/uL (ref 0.7–4.0)
MCV: 62.8 fl — ABNORMAL LOW (ref 78.0–100.0)
Monocytes Absolute: 1 10*3/uL (ref 0.1–1.0)
Neutrophils Relative %: 67.3 % (ref 43.0–77.0)
Platelets: 201 10*3/uL (ref 150.0–400.0)
RDW: 20.3 % — ABNORMAL HIGH (ref 11.5–14.6)
WBC: 9.3 10*3/uL (ref 4.5–10.5)

## 2012-07-01 LAB — LIPID PANEL
Cholesterol: 205 mg/dL — ABNORMAL HIGH (ref 0–200)
Triglycerides: 57 mg/dL (ref 0.0–149.0)

## 2012-07-01 LAB — MICROALBUMIN / CREATININE URINE RATIO: Microalb Creat Ratio: 3.3 mg/g (ref 0.0–30.0)

## 2012-07-01 LAB — LDL CHOLESTEROL, DIRECT: Direct LDL: 143.4 mg/dL

## 2012-07-01 MED ORDER — LOSARTAN POTASSIUM 25 MG PO TABS: 25.0000 mg | ORAL_TABLET | Freq: Every day | ORAL | Status: DC

## 2012-07-01 MED ORDER — GLIMEPIRIDE 2 MG PO TABS: 2.0000 mg | ORAL_TABLET | Freq: Every day | ORAL | Status: DC

## 2012-07-01 MED ORDER — METFORMIN HCL 1000 MG PO TABS: 500.0000 mg | ORAL_TABLET | Freq: Two times a day (BID) | ORAL | Status: DC

## 2012-07-01 MED ORDER — ATORVASTATIN CALCIUM 10 MG PO TABS: 10.0000 mg | ORAL_TABLET | Freq: Every day | ORAL | Status: DC

## 2012-07-01 NOTE — Assessment & Plan Note (Signed)
Td 07 Zostavax discussed Discussed  Exercise Counseled  about tobacco, information provided. labs including   vitamin D,was slt low in the past Colonoscopy Vs.iFOB cards reviewed w/ pt. Provided  iFOB but he will  call if he decides to have a  colonoscopy  Cont w/   ASA

## 2012-07-01 NOTE — Patient Instructions (Addendum)
Come back in 3 months for a routine visit. Is important that you see the eye doctor at least once a year to check for diabetes and at night. Exercise at least 30 minutes daily    Diabetes and Foot Care Diabetes may cause you to have a poor blood supply (circulation) to your legs and feet. Because of this, the skin may be thinner, break easier, and heal more slowly. You also may have nerve damage in your legs and feet causing decreased feeling. You may not notice minor injuries to your feet that could lead to serious problems or infections. Taking care of your feet is one of the most important things you can do for yourself.  HOME CARE INSTRUCTIONS  Do not go barefoot. Bare feet are easily injured.  Check your feet daily for blisters, cuts, and redness.  Wash your feet with warm water (not hot) and mild soap. Pat your feet and between your toes until completely dry.  Apply a moisturizing lotion that does not contain alcohol or petroleum jelly to the dry skin on your feet and to dry brittle toenails. Do not put it between your toes.  Trim your toenails straight across. Do not dig under them or around the cuticle.  Do not cut corns or calluses, or try to remove them with medicine.  Wear clean cotton socks or stockings every day. Make sure they are not too tight. Do not wear knee high stockings since they may decrease blood flow to your legs.  Wear leather shoes that fit properly and have enough cushioning. To break in new shoes, wear them just a few hours a day to avoid injuring your feet.  Wear shoes at all times, even in the house.  Do not cross your legs. This may decrease the blood flow to your feet.  If you find a minor scrape, cut, or break in the skin on your feet, keep it and the skin around it clean and dry. These areas may be cleansed with mild soap and water. Do not use peroxide, alcohol, iodine or Merthiolate.  When you remove an adhesive bandage, be sure not to harm the skin  around it.  If you have a wound, look at it several times a day to make sure it is healing.  Do not use heating pads or hot water bottles. Burns can occur. If you have lost feeling in your feet or legs, you may not know it is happening until it is too late.  Report any cuts, sores or bruises to your caregiver. Do not wait! SEEK MEDICAL CARE IF:   You have an injury that is not healing or you notice redness, numbness, burning, or tingling.  Your feet always feel cold.  You have pain or cramps in your legs and feet. SEEK IMMEDIATE MEDICAL CARE IF:   There is increasing redness, swelling, or increasing pain in the wound.  There is a red line that goes up your leg.  Pus is coming from a wound.  You develop an unexplained oral temperature above 102 F (38.9 C), or as your caregiver suggests.  You notice a bad smell coming from an ulcer or wound. MAKE SURE YOU:   Understand these instructions.  Will watch your condition.  Will get help right away if you are not doing well or get worse. Document Released: 03/13/2000 Document Revised: 06/08/2011 Document Reviewed: 09/19/2008 [facility] Patient Information 2013 [place], [place].

## 2012-07-01 NOTE — Assessment & Plan Note (Signed)
Last visit more than a year ago, no recent eye exam. Not taking Amaryl. Taking metformin only one tablet daily because with higher doses gets diarrhea. Had a long conversation with the patient, his extremely high risk for cardiovascular events due to to diabetes-hypertension-smoking-hyperlipidemia. Additionally, he has not been seen regularly. I asked him to come back in 3 months or sooner if necessary if he desires me to help  managing his chronic medical issues. Daily exercise for at least 30 minutes encouraged. A healthy diet is also advised

## 2012-07-01 NOTE — Assessment & Plan Note (Addendum)
Has not been taking cholesterol medication at least 2 weeks, reports no side effects. Plan: Labs, refill medications

## 2012-07-01 NOTE — Progress Notes (Signed)
  Subjective:    Patient ID: Tristan Figueroa, male    DOB: [birth year], 57 y.o.   MRN: [id number]  HPI Last visit 12-2010, requests a CPX. We also discussed diabetes, hypertension, hyperlipidemia and tobacco abuse; poor med compliance, see a/p  Past Medical History  Diagnosis Date  . Hypertension   . Hyperlipidemia   . Diabetes mellitus     type 2   Past Surgical History  Procedure Laterality Date  . Orif calcaneous fracture  11/25/2011    Procedure: OPEN REDUCTION INTERNAL FIXATION (ORIF) CALCANEOUS FRACTURE;  Surgeon: Nadara Mustard, MD;  Location: [facility] OR;  Service: Orthopedics;  Laterality: Left;  Open Reduction Internal Fixation Left Calcaneous   History   Social History  . Marital Status: Married    Spouse Name: N/A    Number of Children: 2  . Years of Education: N/A   Occupational History  . INSTALLER    Social History Main Topics  . Smoking status: Current Every Day Smoker -- 0.50 packs/day for 1 years    Types: Cigarettes  . Smokeless tobacco: Never Used  . Alcohol Use: No  . Drug Use: No  . Sexually Active: Not on file   Other Topics Concern  . Not on file   Social History Narrative   Active at work, no routine exercise   diet: tries to eat well-healthy   Family History  Problem Relation Age of Onset  . Hypertension Mother   . Diabetes Mother   . Dementia Mother   . Coronary artery disease Neg Hx   . Colon cancer Neg Hx   . Prostate cancer Neg Hx     Review of Systems Denies chest pain or shortness of breath No nausea, vomiting, diarrhea. No lower extremity edema. Vision is occasionally blurred but otherwise normal. Occasional numbness in his toes since he had an accident last year, patient does not believe sx are  related to diabetes.     Objective:   Physical Exam General -- alert, well-developed  Neck --no thyromegaly Lungs -- normal respiratory effort, no intercostal retractions, no accessory muscle use, and normal breath sounds.   Heart--  normal rate, regular rhythm, no murmur, and no gallop.   Abdomen--soft, non-tender, no distention, no masses, no HSM, no guarding, and no rigidity.   Extremities-- no pretibial edema bilaterally Rectal-- No external abnormalities noted. Normal sphincter tone. No rectal masses or tenderness. Brown stool, Hemoccult negative Prostate:  Prostate gland firm and smooth, no enlargement, nodularity, tenderness, mass, asymmetry or induration. Neurologic-- alert & oriented X3 and strength normal in all extremities. Psych-- Cognition and judgment appear intact. Alert and cooperative with normal attention span and concentration.  not anxious appearing and not depressed appearing.      Assessment & Plan:

## 2012-07-01 NOTE — Assessment & Plan Note (Signed)
Information about quitting provided

## 2012-07-01 NOTE — Assessment & Plan Note (Signed)
Run out of BP meds few weeks ago, not ambulatory BPs. Refill medications, see comments under diabetes.

## 2012-07-12 ENCOUNTER — Telehealth: Payer: Self-pay | Admitting: Internal Medicine

## 2012-07-12 MED ORDER — ERGOCALCIFEROL 1.25 MG (50000 UT) PO CAPS: 50000.0000 [IU] | ORAL_CAPSULE | ORAL | Status: AC

## 2012-07-12 MED ORDER — SITAGLIPTIN PHOSPHATE 100 MG PO TABS: 100.0000 mg | ORAL_TABLET | Freq: Every day | ORAL | Status: DC

## 2012-07-12 NOTE — Telephone Encounter (Signed)
Spoke to pt, he was requesting samples of [place]. Pt aware samples are left at front desk.

## 2012-07-12 NOTE — Telephone Encounter (Signed)
Med issues pt would like you to call him

## 2012-07-12 NOTE — Addendum Note (Signed)
Addended by: Edwena Felty T on: 07/12/2012 11:59 AM   Modules accepted: Orders

## 2012-10-03 ENCOUNTER — Other Ambulatory Visit: Payer: Self-pay | Admitting: Internal Medicine

## 2012-10-04 NOTE — Telephone Encounter (Signed)
Refill done.  

## 2012-10-17 ENCOUNTER — Encounter: Payer: Self-pay | Admitting: Internal Medicine

## 2013-05-06 IMAGING — CT CT FOOT*R* W/O CM
3 series · 16 of 33 positions shown, 19 images · non-contrast
Comparison: Plain films 11/23/2011 at 5605 hours.

CLINICAL DATA: Status post fall.  Calcaneal fractures.

CT OF THE RIGHT FOOT WITHOUT CONTRAST
TECHNIQUE: Multidetector CT imaging was performed according to the
standard protocol. Multiplanar CT image reconstructions were also
generated.

[Series 610: true sag rt · sagittal · 0.39mm/px · 5 of 47 slices shown, 6 images]
[im 16/47  bone]
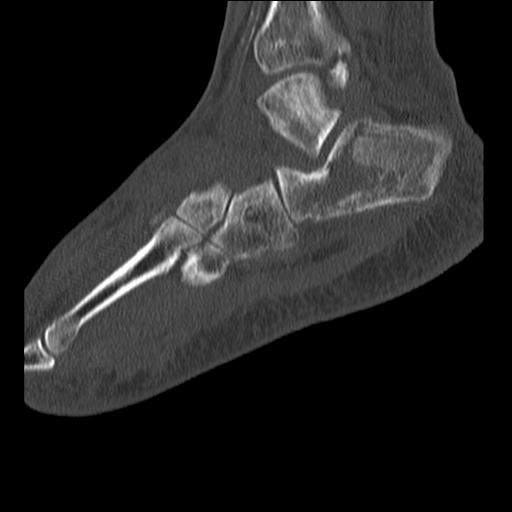
[im 20/47  bone]
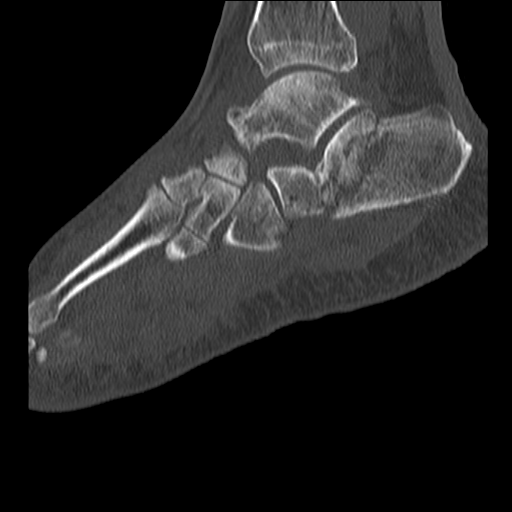
[im 24/47  soft-tissue]
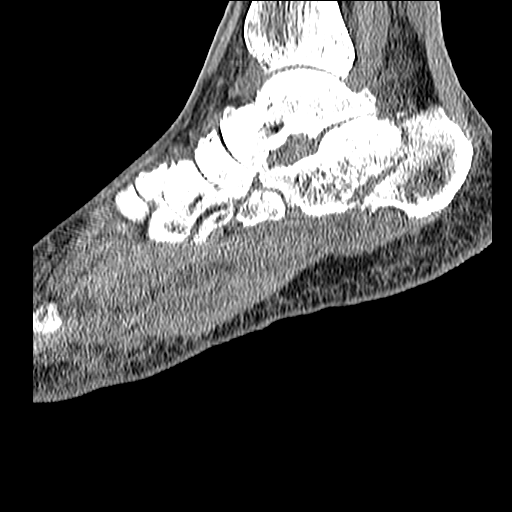
[im 24/47  bone]
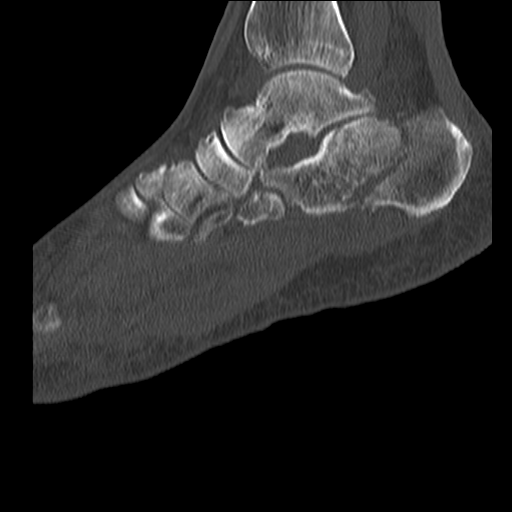
[im 27/47  bone]
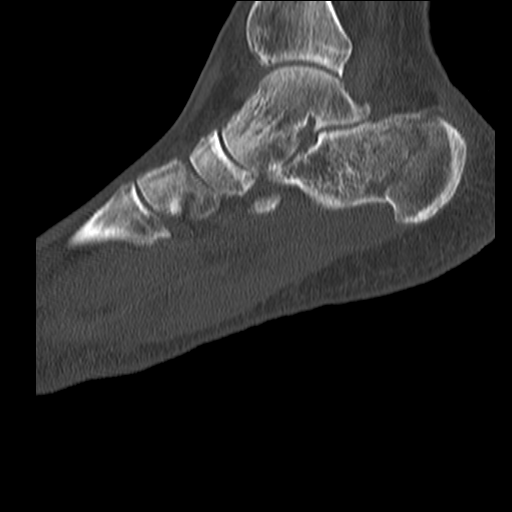
[im 31/47  bone]
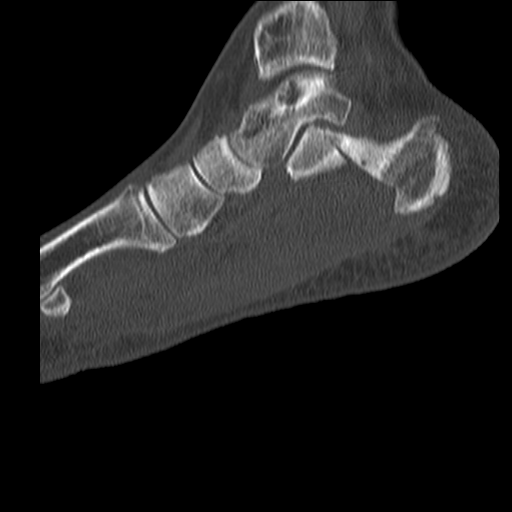

[Series 611: true cor rt · coronal · 0.39mm/px · 3 of 89 slices shown]
[im 31/89  bone]
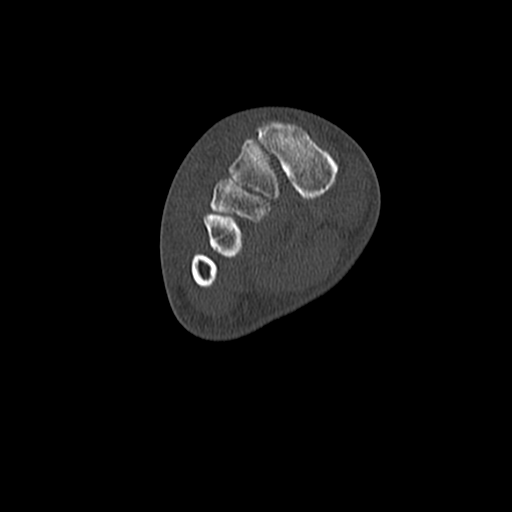
[im 40/89  bone]
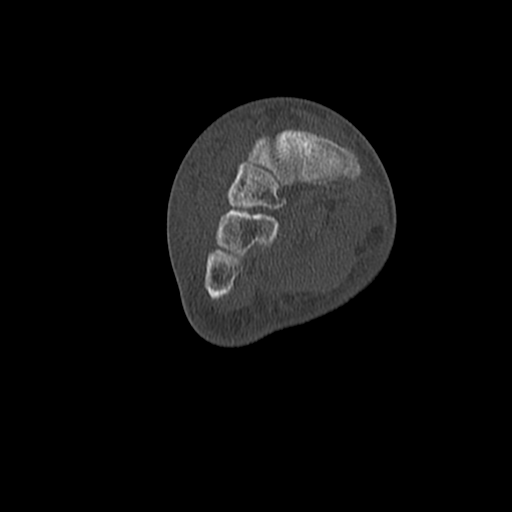
[im 49/89  bone]
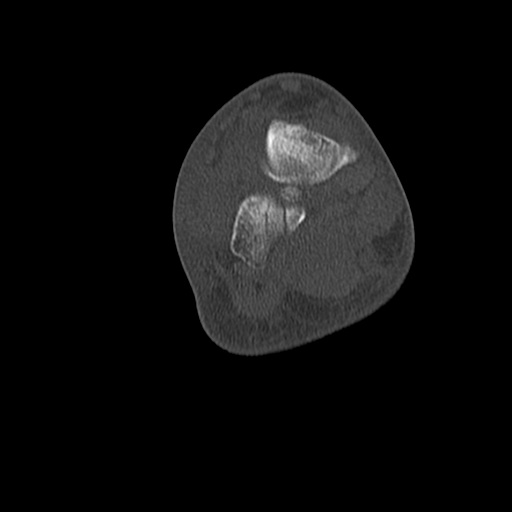

[Series 612: true refor axial rt · axial · 0.39mm/px · z∈[+813,+902]mm · 8 of 57 slices shown, 10 images]
[im 5/57  soft-tissue]
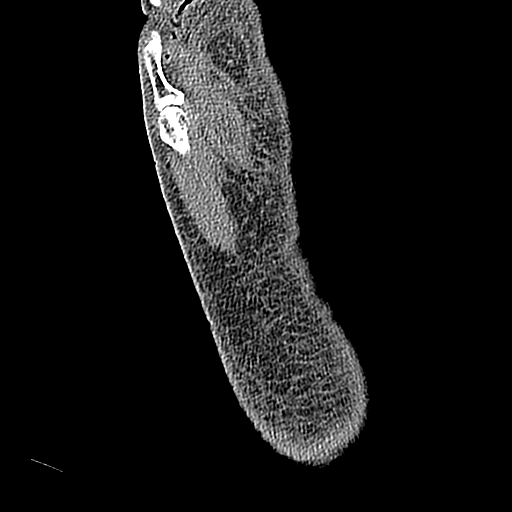
[im 5/57  bone]
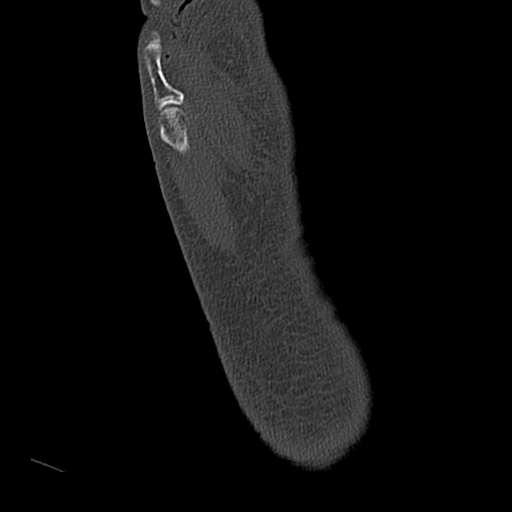
[im 13/57  bone]
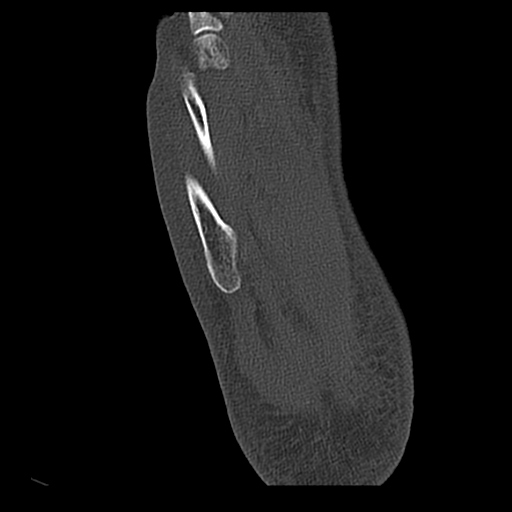
[im 18/57  bone]
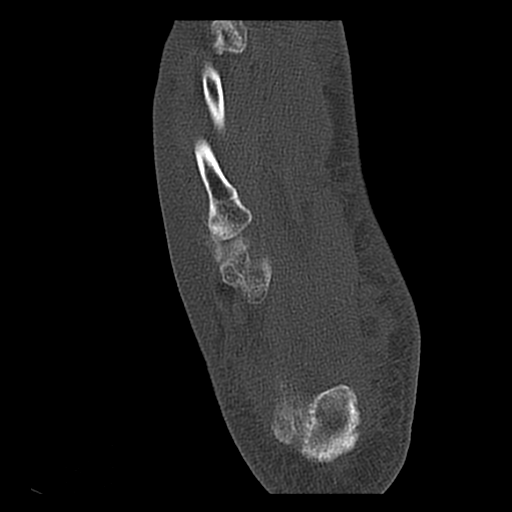
[im 26/57  bone]
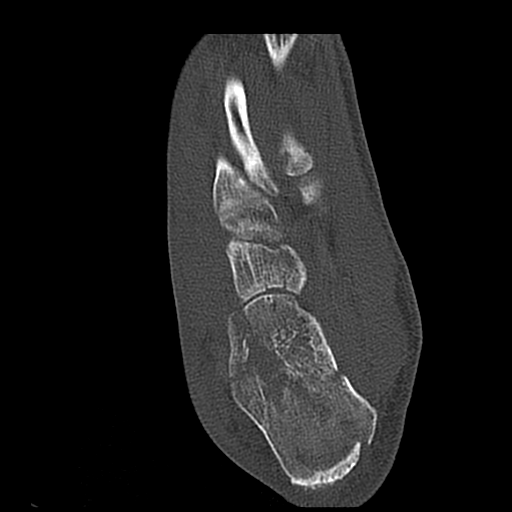
[im 31/57  soft-tissue]
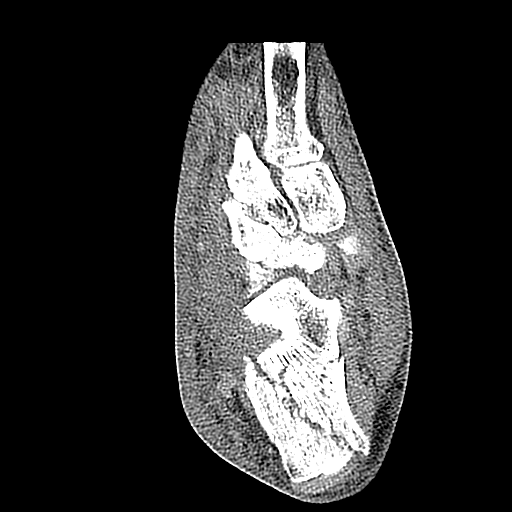
[im 31/57  bone]
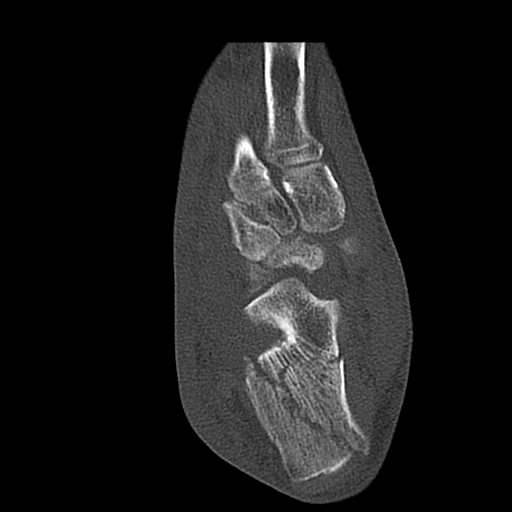
[im 39/57  bone]
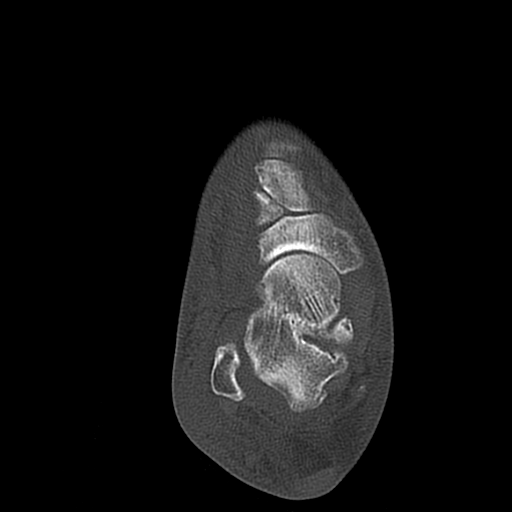
[im 44/57  bone]
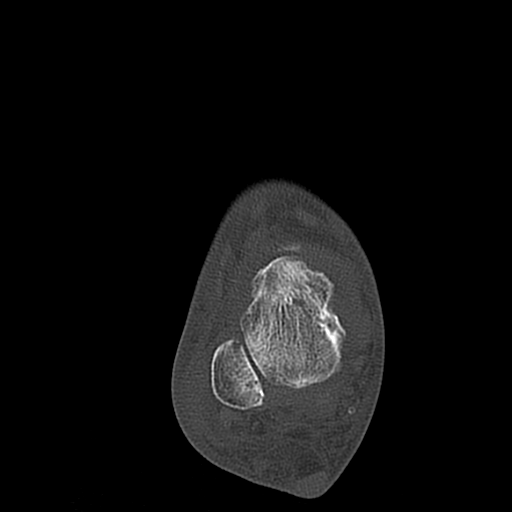
[im 52/57  bone]
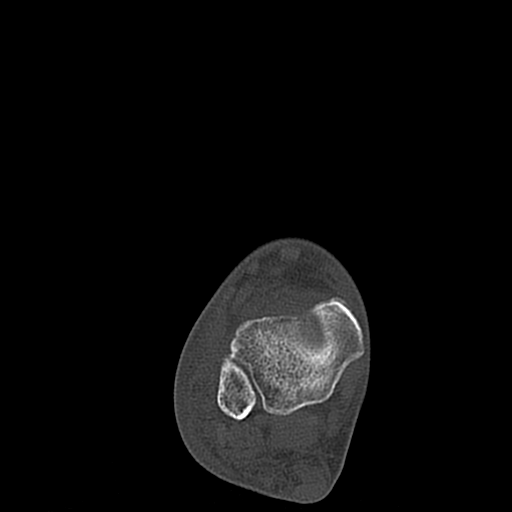

[16 of 33 positions shown; findings below may reference images not displayed]

FINDINGS: There is a calcaneal fracture as seen on plain films.
The calcaneus is divided into three main fragments.   One component
of the fracture originates approximately 1 cm posterior to the
posterior facet of the subtalar joint and extends in an inferior
and oblique anterior orientation through the plantar surface of the
calcaneus. A transverse component of the fracture extends from the
base of the sustentaculum through the lateral most margin of the
posterior facet of the calcaneus.  The articular surface of the
middle facet is preserved.  The fracture also extends to the
articular surface of the calcaneus at the calcaneocuboid joint
without displacement.

The patient also has a mild impaction fracture of the articular
surface of the cuboid at the calcaneocuboid joint.  Longitudinal
component of this fracture extends to the medial most aspect of the
base of the fourth metatarsal without displacement.  No other
fracture is identified.  There is soft tissue swelling about the
foot. The peroneal tendons pass along the margin of the patient's
calcaneal fracture but do not appear entrapped.
IMPRESSION: 1.  Calcaneus fracture as described above involves the posterior
facet of the subtalar joint and calcaneocuboid joint without
displacement in either location.
2.  Mildly impacted fracture the articular surface of the navicular
bone is also identified.
3.  The peroneal tendons pass along the patient's calcaneus
fracture but do not appear entrapped.

## 2013-05-06 IMAGING — CR DG FOOT COMPLETE 3+V*R*
3 series · 3 of 3 positions shown · non-contrast
Comparison: None.

CLINICAL DATA: Right foot pain following an injury.

RIGHT FOOT COMPLETE - 3+ VIEW

[x foot ap right]
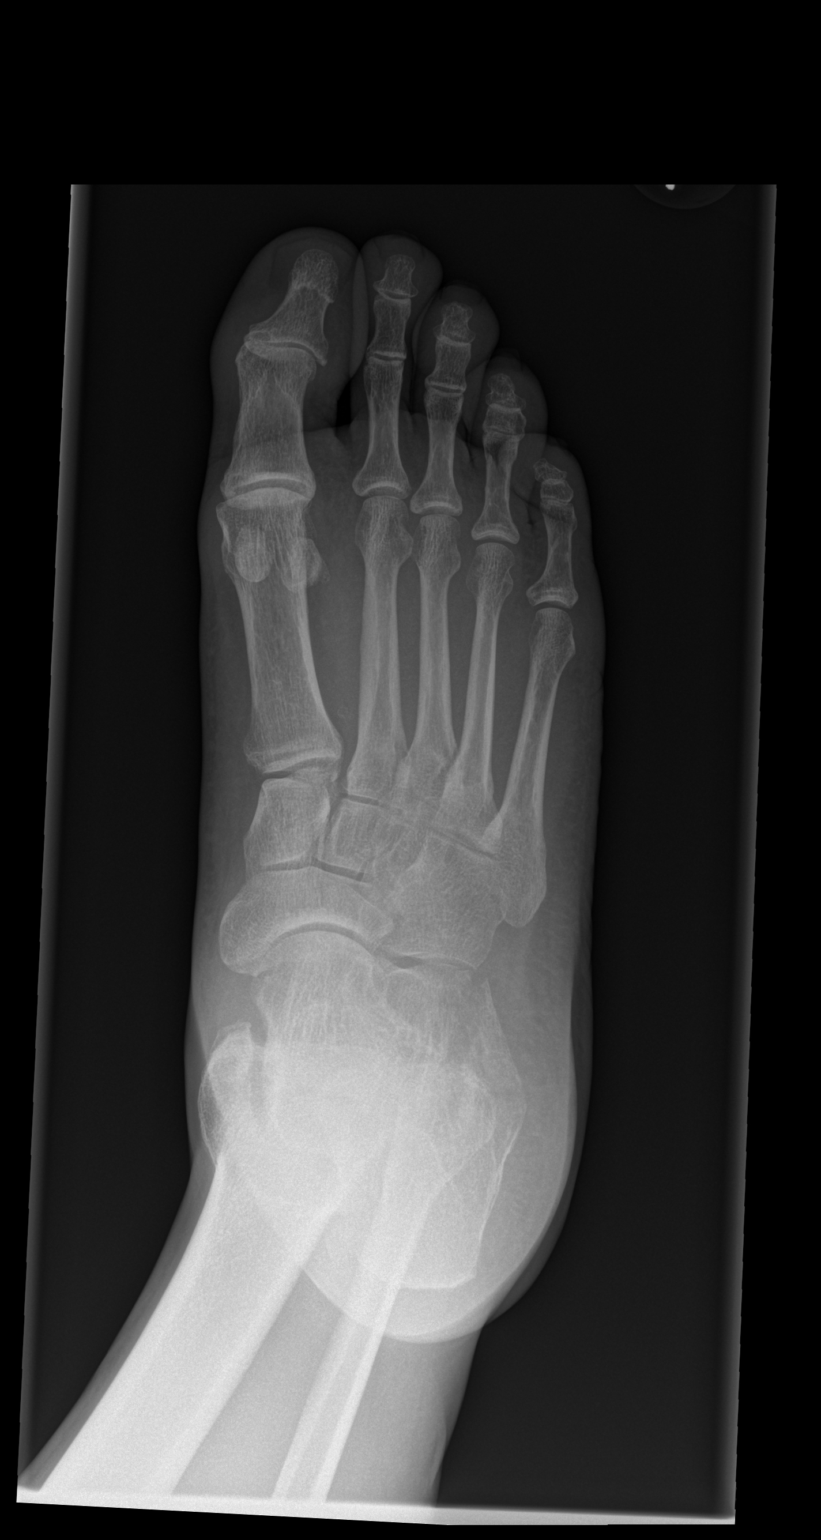

[x foot obl right]
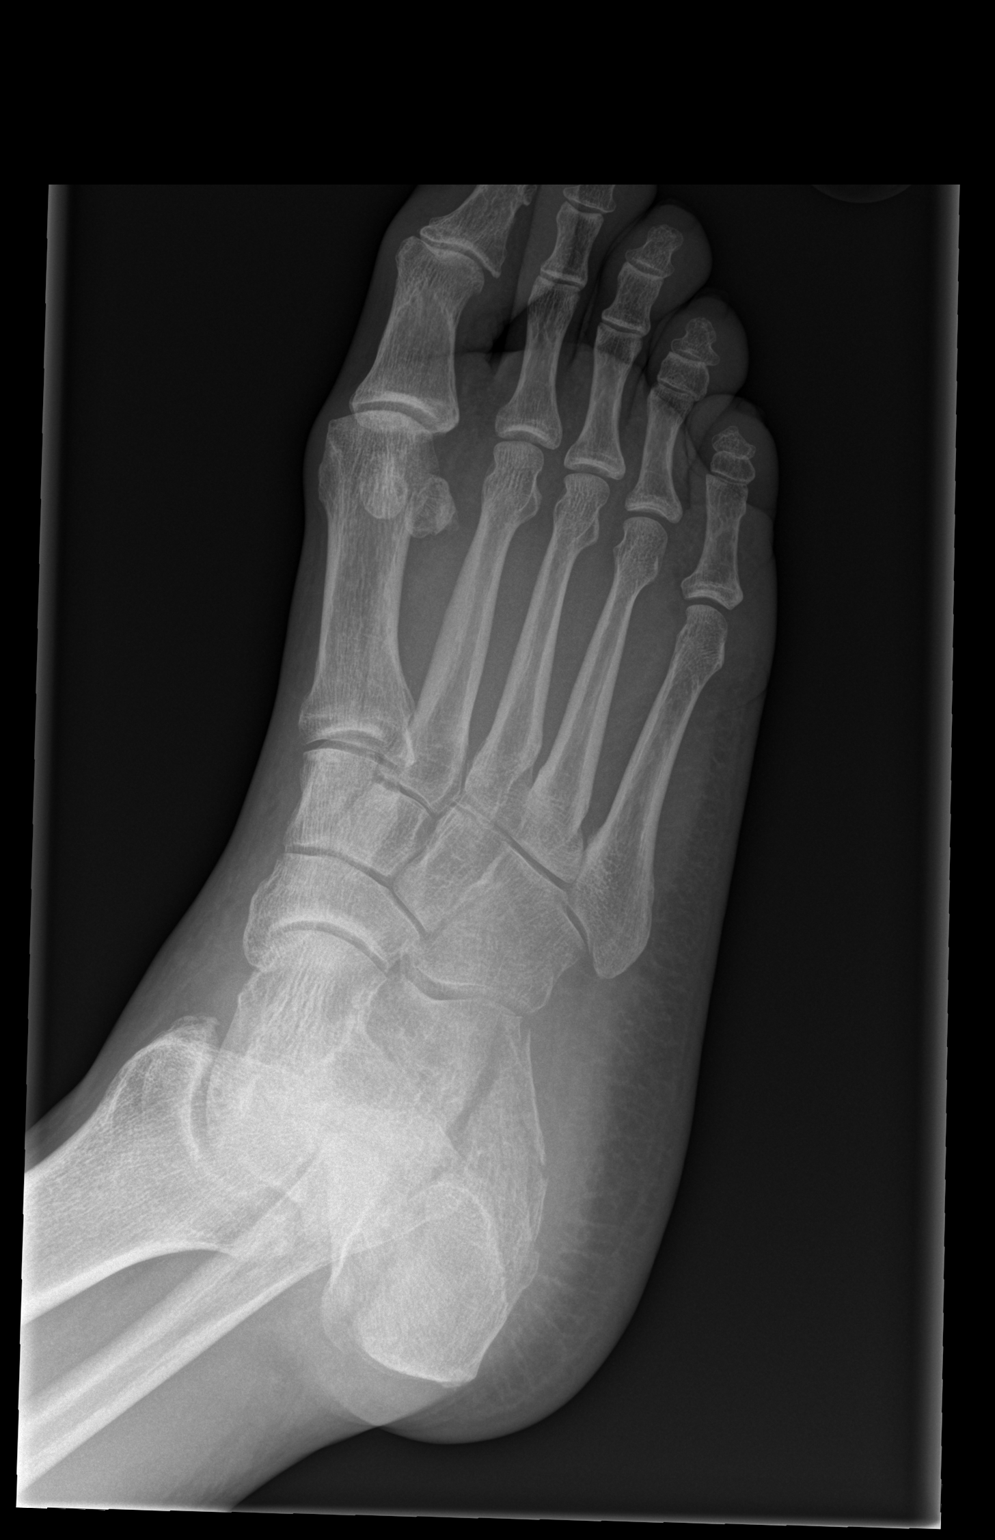

[x foot lat right]
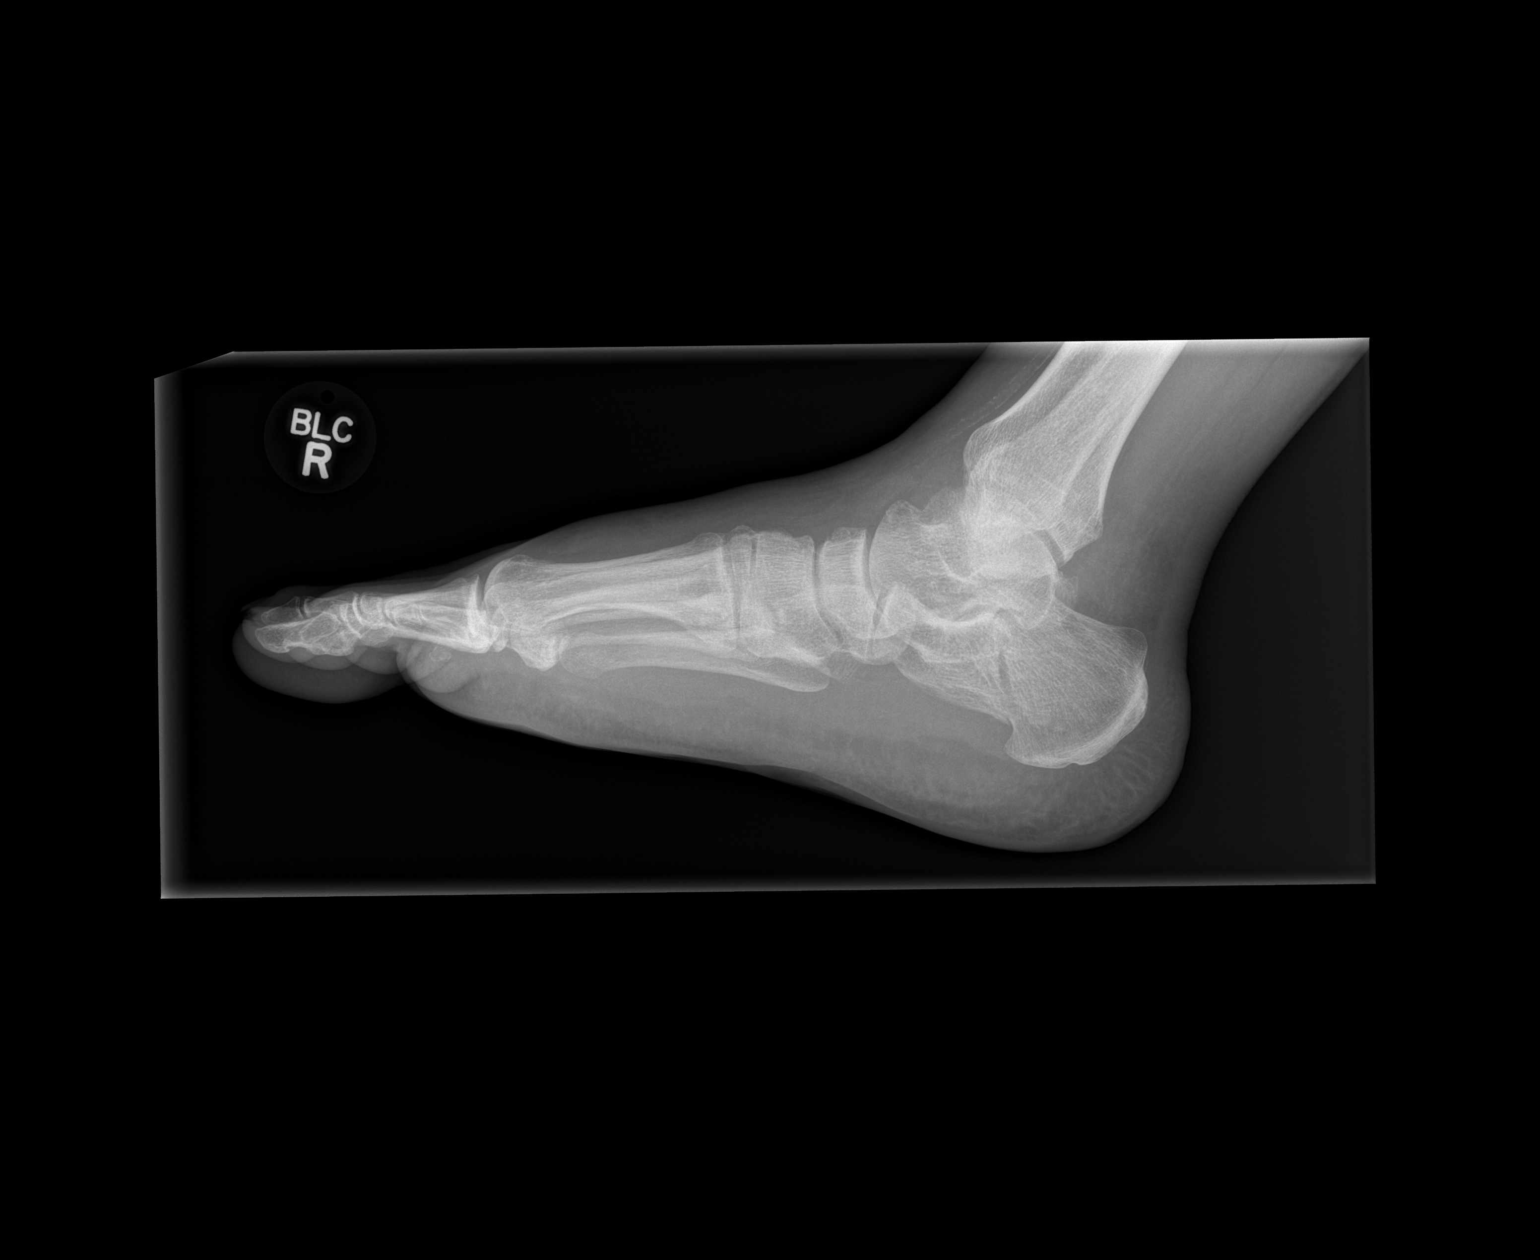

[3 of 3 positions shown; findings below may reference images not displayed]

FINDINGS: Diffuse soft tissue swelling, most pronounced dorsally.
Minimally displaced, comminuted calcaneus fracture.  No other
fractures are visualized.
IMPRESSION: Comminuted calcaneus fracture.  This could be better characterized
with CT.

## 2019-02-21 ENCOUNTER — Other Ambulatory Visit: Payer: Self-pay

## 2019-02-21 DIAGNOSIS — Z20822 Contact with and (suspected) exposure to covid-19: Secondary | ICD-10-CM

## 2019-02-23 LAB — NOVEL CORONAVIRUS, NAA: SARS-CoV-2, NAA: NOT DETECTED

## 2019-02-24 ENCOUNTER — Telehealth: Payer: Self-pay | Admitting: General Practice

## 2019-02-24 NOTE — Telephone Encounter (Signed)
° °  Pt rec neg COVID results °

## 2022-02-14 ENCOUNTER — Other Ambulatory Visit: Payer: Self-pay

## 2022-02-14 ENCOUNTER — Emergency Department (HOSPITAL_COMMUNITY)
Admission: EM | Admit: 2022-02-14 | Discharge: 2022-02-14 | Disposition: A | Discharge status: Home / Self Care | Payer: Medicare PPO | Attending: Emergency Medicine | Admitting: Emergency Medicine

## 2022-02-14 ENCOUNTER — Encounter (HOSPITAL_COMMUNITY): Payer: Self-pay

## 2022-02-14 ENCOUNTER — Emergency Department (HOSPITAL_COMMUNITY): Payer: Medicare PPO

## 2022-02-14 DIAGNOSIS — R790 Abnormal level of blood mineral: Secondary | ICD-10-CM | POA: Diagnosis not present

## 2022-02-14 DIAGNOSIS — R000 Tachycardia, unspecified: Secondary | ICD-10-CM | POA: Diagnosis not present

## 2022-02-14 DIAGNOSIS — E119 Type 2 diabetes mellitus without complications: Secondary | ICD-10-CM | POA: Diagnosis not present

## 2022-02-14 DIAGNOSIS — Z79899 Other long term (current) drug therapy: Secondary | ICD-10-CM | POA: Insufficient documentation

## 2022-02-14 DIAGNOSIS — D72829 Elevated white blood cell count, unspecified: Secondary | ICD-10-CM | POA: Insufficient documentation

## 2022-02-14 DIAGNOSIS — Z7984 Long term (current) use of oral hypoglycemic drugs: Secondary | ICD-10-CM | POA: Diagnosis not present

## 2022-02-14 DIAGNOSIS — I10 Essential (primary) hypertension: Secondary | ICD-10-CM | POA: Diagnosis not present

## 2022-02-14 DIAGNOSIS — Z7982 Long term (current) use of aspirin: Secondary | ICD-10-CM | POA: Insufficient documentation

## 2022-02-14 DIAGNOSIS — R0602 Shortness of breath: Secondary | ICD-10-CM | POA: Diagnosis present

## 2022-02-14 LAB — BASIC METABOLIC PANEL
Anion gap: 11 (ref 5–15)
BUN: 10 mg/dL (ref 8–23)
CO2: 20 mmol/L — ABNORMAL LOW (ref 22–32)
Calcium: 9 mg/dL (ref 8.9–10.3)
Chloride: 108 mmol/L (ref 98–111)
Creatinine, Ser: 0.93 mg/dL (ref 0.61–1.24)
GFR, Estimated: >60 mL/min (ref >60)
Glucose, Bld: 223 mg/dL — ABNORMAL HIGH (ref 70–99)
Potassium: 4 mmol/L (ref 3.5–5.1)
Sodium: 139 mmol/L (ref 135–145)

## 2022-02-14 LAB — CBC
HCT: 43 % (ref 39.0–52.0)
Hemoglobin: 12.7 g/dL — ABNORMAL LOW (ref 13.0–17.0)
MCH: 19.7 pg — ABNORMAL LOW (ref 26.0–34.0)
MCHC: 29.5 g/dL — ABNORMAL LOW (ref 30.0–36.0)
MCV: 66.8 fL — ABNORMAL LOW (ref 80.0–100.0)
Platelets: 214 10*3/uL (ref 150–400)
RBC: 6.44 MIL/uL — ABNORMAL HIGH (ref 4.22–5.81)
RDW: 18.9 % — ABNORMAL HIGH (ref 11.5–15.5)
WBC: 12 10*3/uL — ABNORMAL HIGH (ref 4.0–10.5)
nRBC: 0 % (ref 0.0–0.2)

## 2022-02-14 LAB — TROPONIN I (HIGH SENSITIVITY)
Troponin I (High Sensitivity): 14 ng/L (ref <18)
Troponin I (High Sensitivity): 16 ng/L (ref <18)

## 2022-02-14 LAB — BRAIN NATRIURETIC PEPTIDE: B Natriuretic Peptide: 472.5 pg/mL — ABNORMAL HIGH (ref 0.0–100.0)

## 2022-02-14 MED ORDER — LOSARTAN POTASSIUM 50 MG PO TABS
50.0000 mg | ORAL_TABLET | Freq: Every day | ORAL | Status: AC
  Administered 2022-02-14: 50 mg via ORAL
  Filled 2022-02-14: qty 1

## 2022-02-14 MED ORDER — FUROSEMIDE 20 MG PO TABS: 40.0000 mg | ORAL_TABLET | Freq: Every day | ORAL | 0 refills | Status: DC

## 2022-02-14 MED ORDER — FUROSEMIDE 10 MG/ML IJ SOLN
40.0000 mg | Freq: Once | INTRAMUSCULAR | Status: AC
  Administered 2022-02-14: 40 mg via INTRAVENOUS
  Filled 2022-02-14: qty 4

## 2022-02-14 MED ORDER — IOHEXOL 350 MG/ML SOLN
75.0000 mL | Freq: Once | INTRAVENOUS | Status: AC | PRN
  Administered 2022-02-14: 75 mL via INTRAVENOUS

## 2022-02-14 NOTE — ED Triage Notes (Signed)
Patient complains of shortness of breath x 1 week. No cold, no cough, no chest pain. Smoker. Patient reports that the sob more exertional. No distress

## 2022-02-14 NOTE — Discharge Instructions (Signed)
I have started you on lasix which is a fluid pill. Please take this once daily. You should expect to urinate more often with this medication.  I have provided you with referral to cardiology. You should hear from them soon, but if you do not hear from them by Tuesday, I recommend reaching out to them.   If you have any worsening symptoms, please return to the ED

## 2022-02-14 NOTE — ED Notes (Signed)
Patient returned from xray.

## 2022-02-14 NOTE — ED Provider Notes (Signed)
[facility] EMERGENCY DEPARTMENT Provider Note   CSN: [id number] Arrival date & time: 02/14/22  8756     History Past medical history of diabetes, hyperlipidemia, tobacco use, hypertension No chief complaint on file.   Tristan Figueroa is a 66 y.o. male.  Presents with 1 week of worsening shortness of breath.  He says he most he notices it in the morning when he wakes up and sometimes with exertion.  He seen by the [facility] and he called them, and they asked him to come to the emergency department to be evaluated.  He denies any chest pain, cough, cold symptoms, abd pain, nausea, vomiting, numbness, weakness, change in urination, leg swelling, leg pain.  HPI     Home Medications Prior to Admission medications   Medication Sig Start Date End Date Taking? Authorizing Provider  furosemide (LASIX) 20 MG tablet Take 2 tablets (40 mg total) by mouth daily. 02/14/22 03/16/22 Yes Vidal Lampkins, Finis Bud, PA-C  acetaminophen (TYLENOL) 500 MG tablet Take 1,000 mg by mouth every 6 (six) hours as needed. For pain.    [provider]  aspirin EC 81 MG tablet Take 81 mg by mouth daily.    [provider]  atorvastatin (LIPITOR) 10 MG tablet Take 1 tablet (10 mg total) by mouth daily. 07/01/12 07/01/13  Wanda Plump, MD  ergocalciferol (VITAMIN D2) 50000 UNITS capsule Take 1 capsule (50,000 Units total) by mouth once a week. 07/12/12   Wanda Plump, MD  glimepiride (AMARYL) 2 MG tablet Take 1 tablet (2 mg total) by mouth daily before breakfast. 07/01/12   Wanda Plump, MD  losartan (COZAAR) 25 MG tablet Take 1 tablet (25 mg total) by mouth daily. 07/01/12   Wanda Plump, MD  metFORMIN (GLUCOPHAGE) 1000 MG tablet Take 0.5 tablets (500 mg total) by mouth 2 (two) times daily with a meal. 07/01/12   Wanda Plump, MD  metFORMIN (GLUCOPHAGE) 1000 MG tablet TAKE 2 TABLETS BY MOUTH EVERY DAY 10/03/12   Wanda Plump, MD  sitaGLIPtin (JANUVIA) 100 MG tablet Take 1 tablet (100 mg total) by mouth daily.  07/12/12   Wanda Plump, MD      Allergies    Patient has no known allergies.    Review of Systems   Review of Systems  Respiratory:  Positive for shortness of breath.   All other systems reviewed and are negative.   Physical Exam Updated Vital Signs BP (!) 150/93 (BP Location: Right Arm)   Pulse 94   Temp 98.7 F (37.1 C) (Oral)   Resp (!) 25   SpO2 99%  Physical Exam Vitals and nursing note reviewed.  Constitutional:      General: He is not in acute distress.    Appearance: Normal appearance. He is not ill-appearing, toxic-appearing or diaphoretic.  HENT:     Head: Normocephalic and atraumatic.     Nose: No nasal deformity.     Mouth/Throat:     Lips: Pink. No lesions.     Mouth: Mucous membranes are moist. No injury, lacerations, oral lesions or angioedema.     Pharynx: Oropharynx is clear. Uvula midline. No pharyngeal swelling, oropharyngeal exudate, posterior oropharyngeal erythema or uvula swelling.  Eyes:     General: Gaze aligned appropriately. No scleral icterus.       Right eye: No discharge.        Left eye: No discharge.     Conjunctiva/sclera: Conjunctivae normal.     Right eye:  Right conjunctiva is not injected. No exudate or hemorrhage.    Left eye: Left conjunctiva is not injected. No exudate or hemorrhage. Cardiovascular:     Rate and Rhythm: Normal rate and regular rhythm.     Pulses: Normal pulses.          Radial pulses are 2+ on the right side and 2+ on the left side.       Dorsalis pedis pulses are 2+ on the right side and 2+ on the left side.     Heart sounds: Normal heart sounds, S1 normal and S2 normal. Heart sounds not distant. No murmur heard.    No friction rub. No gallop. No S3 or S4 sounds.  Pulmonary:     Effort: Pulmonary effort is normal. No accessory muscle usage or respiratory distress.     Breath sounds: Normal breath sounds. No stridor. No wheezing, rhonchi or rales.     Comments: No jvd Chest:     Chest wall: No tenderness.   Abdominal:     General: Abdomen is flat. There is no distension.     Palpations: Abdomen is soft. There is no mass or pulsatile mass.     Tenderness: There is no abdominal tenderness. There is no guarding or rebound.  Musculoskeletal:     Right lower leg: No edema.     Left lower leg: No edema.     Comments: No calf pain   Skin:    General: Skin is warm and dry.     Coloration: Skin is not jaundiced or pale.     Findings: No bruising, erythema, lesion or rash.  Neurological:     General: No focal deficit present.     Mental Status: He is alert and oriented to person, place, and time.     GCS: GCS eye subscore is 4. GCS verbal subscore is 5. GCS motor subscore is 6.  Psychiatric:        Mood and Affect: Mood normal.        Behavior: Behavior normal. Behavior is cooperative.     ED Results / Procedures / Treatments   Labs (all labs ordered are listed, but only abnormal results are displayed) Labs Reviewed  BASIC METABOLIC PANEL - Abnormal; Notable for the following components:      Result Value   CO2 20 (*)    Glucose, Bld 223 (*)    All other components within normal limits  CBC - Abnormal; Notable for the following components:   WBC 12.0 (*)    RBC 6.44 (*)    Hemoglobin 12.7 (*)    MCV 66.8 (*)    MCH 19.7 (*)    MCHC 29.5 (*)    RDW 18.9 (*)    All other components within normal limits  BRAIN NATRIURETIC PEPTIDE - Abnormal; Notable for the following components:   B Natriuretic Peptide 472.5 (*)    All other components within normal limits  TROPONIN I (HIGH SENSITIVITY)  TROPONIN I (HIGH SENSITIVITY)    EKG EKG Interpretation  Date/Time:  Saturday February 14 2022 07:55:52 EST Ventricular Rate:  103 PR Interval:  152 QRS Duration: 152 QT Interval:  398 QTC Calculation: 521 R Axis:   243 Text Interpretation: Sinus tachycardia Right bundle branch block Abnormal ECG No previous ECGs available Confirmed by Fredia Sorrow [id number]) on 02/14/2022 12:03:26  PM  Radiology CT Angio Chest PE W and/or Wo Contrast  Result Date: 02/14/2022 CLINICAL DATA:  One-week history of exertional shortness of breath  EXAM: CT ANGIOGRAPHY CHEST WITH CONTRAST TECHNIQUE: Multidetector CT imaging of the chest was performed using the standard protocol during bolus administration of intravenous contrast. Multiplanar CT image reconstructions and MIPs were obtained to evaluate the vascular anatomy. RADIATION DOSE REDUCTION: This exam was performed according to the departmental dose-optimization program which includes automated exposure control, adjustment of the mA and/or kV according to patient size and/or use of iterative reconstruction technique. CONTRAST:  19mL OMNIPAQUE IOHEXOL 350 MG/ML SOLN COMPARISON:  Chest radiograph dated 02/14/2022 FINDINGS: Cardiovascular: The study is high quality for the evaluation of pulmonary embolism. There are no filling defects in the central, lobar, segmental or subsegmental pulmonary artery branches to suggest acute pulmonary embolism. Great vessels are normal in course and caliber. Reflux of contrast material into the hepatic veins, suggesting a degree of right heart dysfunction. Left atrial enlargement.No significant pericardial fluid/thickening. Aortic atherosclerosis. Mediastinum/Nodes: thyroid gland without nodules meeting criteria for imaging follow-up by size. Normal esophagus. No pathologically enlarged axillary, supraclavicular, mediastinal, or hilar lymph nodes. Lungs/Pleura: The central airways are patent. Diffuse bronchial wall thickening. Diffuse predominantly subpleural and perifissural nodules, for example measuring up to 5 mm in the middle lobe (6:80). Additional more central nodules in the right upper lobe measuring 3 mm (6:43). Diffuse ground-glass opacities with interspersed interlobular septal thickening in a crazy paving pattern. No pneumothorax. Small bilateral pleural effusions, right-greater-than-left. Upper abdomen:  Thickening of the left adrenal gland without discrete adrenal nodule possibly reflecting adrenal hyperplasia. Musculoskeletal: No acute or abnormal lytic or blastic osseous lesions. Multilevel degenerative changes of the thoracic spine. Review of the MIP images confirms the above findings. IMPRESSION: 1. No evidence of pulmonary embolism. 2. Diffuse ground-glass opacities with interspersed interlobular septal thickening in a crazy paving pattern, most consistent with pulmonary edema, although atypical pneumonia can have a similar appearance. 3. Small bilateral pleural effusions, right-greater-than-left. 4. Diffuse predominantly subpleural and perifissural nodules, measuring up to 5 mm in the middle lobe. No follow-up needed if patient is low-risk (and has no known or suspected primary neoplasm). Non-contrast chest CT can be considered in 12 months if patient is high-risk. This recommendation follows the consensus statement: Guidelines for Management of Incidental Pulmonary Nodules Detected on CT Images: From the Fleischner Society 2017; Radiology 2017; 284:228-243. 5. Reflux of contrast material into the hepatic veins, suggesting a degree of right heart dysfunction. Consider correlation with echocardiography. 6. Left atrial enlargement. 7. Aortic Atherosclerosis (ICD10-I70.0). Electronically Signed   By: Darrin Nipper M.D.   On: 02/14/2022 11:13   DG Chest 2 View  Result Date: 02/14/2022 CLINICAL DATA:  66 year old male with history of shortness of breath. EXAM: CHEST - 2 VIEW COMPARISON:  Chest x-ray 11/24/2011. FINDINGS: There is cephalization of the pulmonary vasculature and slight indistinctness of the interstitial markings suggestive of mild pulmonary edema. No definite pleural effusions. No pneumothorax. Heart size is mildly enlarged. Upper mediastinal contours are within normal limits. Atherosclerotic calcifications are noted in the thoracic aorta. IMPRESSION: 1. The appearance the chest suggests mild  congestive heart failure, as above. 2. Aortic atherosclerosis. Electronically Signed   By: Vinnie Langton M.D.   On: 02/14/2022 08:49    Procedures Procedures  This patient was on telemetry or cardiac monitoring during their time in the ED.    Medications Ordered in ED Medications  losartan (COZAAR) tablet 50 mg (50 mg Oral Given 02/14/22 0936)  iohexol (OMNIPAQUE) 350 MG/ML injection 75 mL (75 mLs Intravenous Contrast Given 02/14/22 1052)  furosemide (LASIX) injection 40 mg (40 mg  Intravenous Given 02/14/22 1204)    ED Course/ Medical Decision Making/ A&P                           Medical Decision Making Amount and/or Complexity of Data Reviewed Labs: ordered. Radiology: ordered.  Risk Prescription drug management.    MDM  This is a 66 y.o. male who presents to the ED with shortness of breath The differential of this patient includes but is not limited to CHF, ACS, COPD, Asthma, PNA, Anaphylaxis, PE, PTX, Anxiety, Viral PNA, and Bronchitis.  Initial Impression  Well appearing, slightly tachypnic, no acute distress. Minimally tachycardic. BP high to 185/95. He has not taken his home morning Losartan so will dose this here.  He has no evidence of hypervolemia on exam but continues to have shortness of breath. Will order CTA chest to r/o  I personally ordered, reviewed, and interpreted all laboratory work and imaging and agree with radiologist interpretation. Results interpreted below: White Blood cell count is 12, hemoglobin stable from baseline, renal function normal, electrolytes are okay, troponin is 14 and then 16, BNP is 472 EKG with right bundle branch block.  No baseline EKG for [place] to look at.  Is nonischemic. Chest x-ray shows possible pulmonary edema. PA chest reveals no PE, most likely pulmonary edema and fluid overload.  There is also a degree of contrast back up into hepatic veins suggesting right heart dysfunction   Assessment/Plan:  Work-up is concerning for  possible right heart dysfunction and pulmonary edema.  Elevated BNP, this could be concerning for heart failure.  He does not look clinically hypervolemic.  He did respond to Lasix here.  He is oxygenating well.  Blood pressure is improved.  He is hemodynamically stable here.  Do not feel he requires any admission today.  I sent him home with p.o. Lasix.  He is given referral to cardiology.  Return precaution provided.   Charting Requirements Additional history is obtained from:  Independent historian External Records from outside source obtained and reviewed including: n/a Social Determinants of Health:  none Pertinant PMH that complicates patient's illness: HTN  Patient Care Problems that were addressed during this visit: - Shortness of breath: Acute illness with systemic symptoms This patient was maintained on a cardiac monitor/telemetry. I personally viewed and interpreted the cardiac monitor which reveals an underlying rhythm of NSR/ST Medications given in ED: Lasix Reevaluation of the patient after these medicines showed that the patient improved I have reviewed home medications and made changes accordingly.  Critical Care Interventions: n/a Consultations: n/a Disposition: discharge  This is a supervised visit with my attending physician, Dr. Rogene Houston. We have discussed this patient and they have altered the plan as needed.  Portions of this note were generated with Lobbyist. Dictation errors may occur despite best attempts at proofreading.    Final Clinical Impression(s) / ED Diagnoses Final diagnoses:  Shortness of breath    Rx / DC Orders ED Discharge Orders          Ordered    furosemide (LASIX) 20 MG tablet  Daily        02/14/22 1245    Ambulatory referral to Cardiology       Comments: Possible new heart failure. Elevated BNP, Pulmonary Edema Started on Oral lasix   02/14/22 1246              Inanna Telford, Adora Fridge, PA-C 02/14/22 1258     [place],  Nicki Reaper, MD 02/17/22 [phone]

## 2022-02-18 ENCOUNTER — Encounter: Payer: Self-pay | Admitting: Interventional Cardiology

## 2022-02-18 ENCOUNTER — Ambulatory Visit: Payer: Medicare PPO | Attending: Interventional Cardiology | Admitting: Interventional Cardiology

## 2022-02-18 VITALS — BP 136/72 | HR 93 | Ht 67.0 in | Wt 182.0 lb

## 2022-02-18 DIAGNOSIS — R0602 Shortness of breath: Secondary | ICD-10-CM

## 2022-02-18 DIAGNOSIS — E1159 Type 2 diabetes mellitus with other circulatory complications: Secondary | ICD-10-CM | POA: Diagnosis not present

## 2022-02-18 DIAGNOSIS — I10 Essential (primary) hypertension: Secondary | ICD-10-CM | POA: Diagnosis not present

## 2022-02-18 DIAGNOSIS — E782 Mixed hyperlipidemia: Secondary | ICD-10-CM

## 2022-02-18 DIAGNOSIS — I700 Atherosclerosis of aorta: Secondary | ICD-10-CM

## 2022-02-18 NOTE — Progress Notes (Signed)
Cardiology Office Note   Date:  02/18/2022   ID:  Tristan Figueroa, DOB 05-07-1955, MRN [id number]  PCP:  [facility]    No chief complaint on file.  Shortness of breath  Wt Readings from Last 3 Encounters:  02/18/22 182 lb (82.6 kg)  07/01/12 177 lb (80.3 kg)  11/24/11 168 lb 6.9 oz (76.4 kg)       History of Present Illness: Tristan Figueroa is a 66 y.o. male who is being seen today for the evaluation of shortness of breath at the request of [facility].   Seen in the ER on November 18 for shortness of breath.  Records show the following history: "Presents with 1 week of worsening shortness of breath.  He says he most he notices it in the morning when he wakes up and sometimes with exertion.  He seen by the [facility] and he called them, and they asked him to come to the emergency department to be evaluated.  He denies any chest pain, cough, cold symptoms, abd pain, nausea, vomiting, numbness, weakness, change in urination, leg swelling, leg pain. "  In the ER, BNP was 472.  Troponin was negative.  Patient with diabetes.  He has been on low-dose losartan and low-dose statin.  Chest CT in November 2023 showed: "No evidence of pulmonary embolism. 2. Diffuse ground-glass opacities with interspersed interlobular septal thickening in a crazy paving pattern, most consistent with pulmonary edema, although atypical pneumonia can have a similar appearance. 3. Small bilateral pleural effusions, right-greater-than-left. 4. Diffuse predominantly subpleural and perifissural nodules, measuring up to 5 mm in the middle lobe. No follow-up needed if patient is low-risk (and has no known or suspected primary neoplasm). Non-contrast chest CT can be considered in 12 months if patient is high-risk. This recommendation follows the consensus statement: Guidelines for Management of Incidental Pulmonary Nodules Detected on CT Images: From the Fleischner Society 2017;  Radiology 2017; 284:228-243. 5. Reflux of contrast material into the hepatic veins, suggesting a degree of right heart dysfunction. Consider correlation with echocardiography. 6. Left atrial enlargement. 7. Aortic Atherosclerosis (ICD10-I70.0)."  Sent home on Lasix 20 mg BID.  Has been taking this as prescribed.  Does not remember excess salt intake prior to ER admission.   No prior cardiac issues or tests.  Currently smoking. Not really trying to quit. He has cut back.    He walks 3 miles a day.  SInce the ER visit, Denies : Chest pain. Dizziness. Leg edema. Nitroglycerin use. Orthopnea. Palpitations. Paroxysmal nocturnal dyspnea. Shortness of breath. Syncope.    Past Medical History:  Diagnosis Date   Diabetes mellitus    type 2   Hyperlipidemia    Hypertension    SOB (shortness of breath)     Past Surgical History:  Procedure Laterality Date   ORIF CALCANEOUS FRACTURE  11/25/2011   Procedure: OPEN REDUCTION INTERNAL FIXATION (ORIF) CALCANEOUS FRACTURE;  Surgeon: Newt Minion, MD;  Location: [place];  Service: Orthopedics;  Laterality: Left;  Open Reduction Internal Fixation Left Calcaneous     Current Outpatient Medications  Medication Sig Dispense Refill   acetaminophen (TYLENOL) 500 MG tablet Take 1,000 mg by mouth every 6 (six) hours as needed. For pain.     aspirin EC 81 MG tablet Take 81 mg by mouth daily.     ergocalciferol (VITAMIN D2) 50000 UNITS capsule Take 1 capsule (50,000 Units total) by mouth once a week. 12 capsule 0   furosemide (LASIX) 20 MG  tablet Take 2 tablets (40 mg total) by mouth daily. 60 tablet 0   glimepiride (AMARYL) 2 MG tablet Take 1 tablet (2 mg total) by mouth daily before breakfast. 30 tablet 2   HYDROPHILIC EX APPLY THIN LAYER TO AFFECTED AREA TWICE A DAY TO DRY SKIN ON FEET     insulin glargine-yfgn (SEMGLEE) 100 UNIT/ML injection INJECT 34 UNITS SUBCUTANEOUSLY AT BEDTIME FOR DIABETES (USE WITHIN 28 DAYS AFTER OPENING VIAL) (CONVERTED FROM  LANTUS)     losartan (COZAAR) 25 MG tablet Take 1 tablet (25 mg total) by mouth daily. 30 tablet 2   metFORMIN (GLUCOPHAGE) 1000 MG tablet Take 0.5 tablets (500 mg total) by mouth 2 (two) times daily with a meal. 30 tablet 2   atorvastatin (LIPITOR) 10 MG tablet Take 1 tablet (10 mg total) by mouth daily. 30 tablet 2   metFORMIN (GLUCOPHAGE) 1000 MG tablet TAKE 2 TABLETS BY MOUTH EVERY DAY (Patient not taking: Reported on 02/18/2022) 60 tablet 6   sitaGLIPtin (JANUVIA) 100 MG tablet Take 1 tablet (100 mg total) by mouth daily. (Patient not taking: Reported on 02/18/2022) 30 tablet 3   No current facility-administered medications for this visit.    Allergies:   Metformin hcl and Shrimp extract allergy skin test    Social History:  The patient  reports that he has been smoking cigarettes. He has a 0.50 pack-year smoking history. He has never used smokeless tobacco. He reports that he does not drink alcohol and does not use drugs.   Family History:  The patient's family history includes Dementia in his mother; Diabetes in his mother; Hypertension in his mother.    ROS:  Please see the history of present illness.   Otherwise, review of systems are positive for recent shortness of breath.   All other systems are reviewed and negative.    PHYSICAL EXAM: VS:  BP 136/72   Pulse 93   Ht _0  (1.702 m)   Wt 182 lb (82.6 kg)   SpO2 96%   BMI 28.51 kg/m  , BMI Body mass index is 28.51 kg/m. GEN: Well nourished, well developed, in no acute distress HEENT: normal Neck: no JVD, carotid bruits, or masses Cardiac: RRR; no murmurs, rubs, or gallops,no edema  Respiratory:  clear to auscultation bilaterally, normal work of breathing GI: soft, nontender, nondistended, + BS MS: no deformity or atrophy Skin: warm and dry, no rash Neuro:  Strength and sensation are intact Psych: euthymic mood, full affect   EKG:   The ekg ordered in 02/14/22 demonstrates sinus tach RBBB   Recent  Labs: 02/14/2022: B Natriuretic Peptide 472.5; BUN 10; Creatinine, Ser 0.93; Hemoglobin 12.7; Platelets 214; Potassium 4.0; Sodium 139   Lipid Panel    Component Value Date/Time   CHOL 205 (H) 07/01/2012 0842   TRIG 57.0 07/01/2012 0842   TRIG 72 04/06/2006 0926   HDL 49.80 07/01/2012 0842   CHOLHDL 4 07/01/2012 0842   VLDL 11.4 07/01/2012 0842   LDLCALC 131 (H) 01/07/2011 1110   LDLDIRECT 143.4 07/01/2012 0842     Other studies Reviewed: Additional studies/ records that were reviewed today with results demonstrating: ER records reviewed.   ASSESSMENT AND PLAN:  Shortness of breath: Elevated BNP.  He did respond to IV Lasix in the emergency room.  Concern for right heart dysfunction.  Check echocardiogram.  If his EF is low, he would need cardiac cath.  Recheck be met and BNP today.  For now, continue furosemide 20 mg p.o.  twice daily.  I counseled him to be careful about salt intake, especially tomorrow on Thanksgiving. Diabetes: High-fiber diet.  Exercise target noted below.  Check A1C today as well.  He does walk about an hour a day. Hypertension: Low salt diet.  Avoid processed foods.  Controlled today.  Was high in the emergency room. Hyperlipidemia: Whole food, plant-based diet recommended. Aortic atherosclerosis: Continue atorvastatin 10 mg daily.  Smoking: Needs to stop.  Discussed aid to stop smoking but he declined.     Current medicines are reviewed at length with the patient today.  The patient concerns regarding his medicines were addressed.  The following changes have been made:  No change  Labs/ tests ordered today include:  No orders of the defined types were placed in this encounter.   Recommend 150 minutes/week of aerobic exercise Low fat, low carb, high fiber diet recommended  Disposition:   FU in 4-6 weeks   Signed, Larae Grooms, MD  02/18/2022 1:26 PM    [facility] [place], [place], [place]  [address] Phone: [phone]; Fax: [phone]

## 2022-02-18 NOTE — Patient Instructions (Signed)
Medication Instructions:  Your physician recommends that you continue on your current medications as directed. Please refer to the Current Medication list given to you today.  *If you need a refill on your cardiac medications before your next appointment, please call your pharmacy*   Lab Work: Lab work to be done today--BMP and BNP If you have labs (blood work) drawn today and your tests are completely normal, you will receive your results only by: MyChart Message (if you have MyChart) OR A paper copy in the mail If you have any lab test that is abnormal or we need to change your treatment, we will call you to review the results.   Testing/Procedures: Your physician has requested that you have an echocardiogram. Echocardiography is a painless test that uses sound waves to create images of your heart. It provides your doctor with information about the size and shape of your heart and how well your heart's chambers and valves are working. This procedure takes approximately one hour. There are no restrictions for this procedure. Please do NOT wear cologne, perfume, aftershave, or lotions (deodorant is allowed). Please arrive 15 minutes prior to your appointment time.    Follow-Up: At [facility], you and your health needs are our priority.  As part of our continuing mission to provide you with exceptional heart care, we have created designated Provider Care Teams.  These Care Teams include your primary Cardiologist (physician) and Advanced Practice Providers (APPs -  Physician Assistants and Nurse Practitioners) who all work together to provide you with the care you need, when you need it.  We recommend signing up for the patient portal called "MyChart".  Sign up information is provided on this After Visit Summary.  MyChart is used to connect with patients for Virtual Visits (Telemedicine).  Patients are able to view lab/test results, encounter notes, upcoming appointments, etc.   Non-urgent messages can be sent to your provider as well.   To learn more about what you can do with MyChart, go to [email or web address].    Your next appointment:   4 -6 week(s)  The format for your next appointment:   In Person  Provider:   Lance Muss, MD or APP    Other Instructions   Important Information About Sugar

## 2022-02-19 LAB — HEMOGLOBIN A1C
Est. average glucose Bld gHb Est-mCnc: 240 mg/dL
Hgb A1c MFr Bld: 10 % — ABNORMAL HIGH (ref 4.8–5.6)

## 2022-02-19 LAB — BASIC METABOLIC PANEL
BUN/Creatinine Ratio: 16 (ref 10–24)
BUN: 16 mg/dL (ref 8–27)
CO2: 21 mmol/L (ref 20–29)
Calcium: 9.1 mg/dL (ref 8.6–10.2)
Chloride: 102 mmol/L (ref 96–106)
Creatinine, Ser: 1.02 mg/dL (ref 0.76–1.27)
Glucose: 265 mg/dL — ABNORMAL HIGH (ref 70–99)
Potassium: 3.9 mmol/L (ref 3.5–5.2)
Sodium: 138 mmol/L (ref 134–144)
eGFR: 81 mL/min/{1.73_m2} (ref >59)

## 2022-02-19 LAB — PRO B NATRIURETIC PEPTIDE: NT-Pro BNP: 408 pg/mL — ABNORMAL HIGH (ref 0–376)

## 2022-03-16 ENCOUNTER — Other Ambulatory Visit (HOSPITAL_COMMUNITY): Payer: Medicare PPO

## 2022-03-17 ENCOUNTER — Telehealth (HOSPITAL_COMMUNITY): Payer: Self-pay | Admitting: Interventional Cardiology

## 2022-03-17 NOTE — Telephone Encounter (Signed)
I called to reschedule the NO Showed echocardiogram on 03/16/22.  Patient states that he is having at the [place]. Order will be removed from the echo WQ.

## 2022-03-27 NOTE — Progress Notes (Deleted)
Cardiology Office Note:    Date:  03/27/2022   ID:  Tristan Figueroa, DOB 05-10-1955, MRN [id number]  PCP:  [facility]   [facility] Providers Cardiologist:  Lance Muss, MD { Click to update primary MD,subspecialty MD or APP then REFRESH:1}    Referring MD: [facility], [facility]   Chief Complaint: ***  History of Present Illness:    Tristan Figueroa is a *** 66 y.o. male with a hx of diabetes, HTN, aortic atherosclerosis, tobacco abuse, and HLD.  He was referred to cardiology by [facility] clinic for evaluation of shortness of breath and seen by Dr. Eldridge Dace on 02/18/2022.  Had been seen in the ER on November 18 for shortness of breath.  Records show "presents with 1 week of worsening shortness of breath.  He says he most notices it in the morning when he wakes up and sometimes with exertion.  [facility].  He denied chest pain, cough, cold symptoms, abdominal pain, nausea, vomiting, numbness, weakness, change in urination, leg swelling,leg pain." BNP was 472, troponin was negative.  No evidence of pulmonary embolism on chest CT.  He had diffuse groundglass opacities with interspersed interlobular septal thickening and a pattern most consistent with pulmonary edema although atypical pneumonia could have a similar appearance, small bilateral pleural effusions right greater than left, diffuse predominantly subpleural and para fistular nodules measuring up to 5 mm in the middle lobe, no follow-up needed if patient is low risk. Had good response to IV Lasix in the ER. Lab results from that visit revealed mildly increased BNP at 408, stable electrolytes, A1C 10. Was encouraged to work on better control of diabetes with PCP. 2D echo was ordered for evaluation of heart and valve function, he was a no show to appointment on 12/18.   Today, he is here   Past Medical History:  Diagnosis Date   Diabetes mellitus    type 2    Hyperlipidemia    Hypertension    SOB (shortness of breath)     Past Surgical History:  Procedure Laterality Date   ORIF CALCANEOUS FRACTURE  11/25/2011   Procedure: OPEN REDUCTION INTERNAL FIXATION (ORIF) CALCANEOUS FRACTURE;  Surgeon: Nadara Mustard, MD;  Location: [facility] OR;  Service: Orthopedics;  Laterality: Left;  Open Reduction Internal Fixation Left Calcaneous    Current Medications: No outpatient medications have been marked as taking for the 04/01/22 encounter (Appointment) with Lissa Hoard, Zachary George, NP.     Allergies:   Metformin hcl and Shrimp extract allergy skin test   Social History   Socioeconomic History   Marital status: Married    Spouse name: Not on file   Number of children: 2   Years of education: Not on file   Highest education level: Not on file  Occupational History   Occupation: INSTALLER    Employer: [employer]  Tobacco Use   Smoking status: Every Day    Packs/day: 0.50    Years: 1.00    Total pack years: 0.50    Types: Cigarettes   Smokeless tobacco: Never  Substance and Sexual Activity   Alcohol use: No   Drug use: No   Sexual activity: Not on file  Other Topics Concern   Not on file  Social History Narrative   Active at work, no routine exercise   diet: tries to eat well-healthy   Social Determinants of Corporate investment banker  Strain: Not on file  Food Insecurity: Not on file  Transportation Needs: Not on file  Physical Activity: Not on file  Stress: Not on file  Social Connections: Not on file     Family History: The patient's ***family history includes Dementia in his mother; Diabetes in his mother; Hypertension in his mother. There is no history of Coronary artery disease, Colon cancer, or Prostate cancer.  ROS:   Please see the history of present illness.    *** All other systems reviewed and are negative.  Labs/Other Studies Reviewed:    The following studies were reviewed today: ***  Recent Labs: 02/14/2022: B  Natriuretic Peptide 472.5; Hemoglobin 12.7; Platelets 214 02/18/2022: BUN 16; Creatinine, Ser 1.02; NT-Pro BNP 408; Potassium 3.9; Sodium 138  Recent Lipid Panel    Component Value Date/Time   CHOL 205 (H) 07/01/2012 0842   TRIG 57.0 07/01/2012 0842   TRIG 72 04/06/2006 0926   HDL 49.80 07/01/2012 0842   CHOLHDL 4 07/01/2012 0842   VLDL 11.4 07/01/2012 0842   LDLCALC 131 (H) 01/07/2011 1110   LDLDIRECT 143.4 07/01/2012 0842     Risk Assessment/Calculations:   {Does this patient have ATRIAL FIBRILLATION?:[id number]}       Physical Exam:    VS:  There were no vitals taken for this visit.    Wt Readings from Last 3 Encounters:  02/18/22 182 lb (82.6 kg)  07/01/12 177 lb (80.3 kg)  11/24/11 168 lb 6.9 oz (76.4 kg)     GEN: *** Well nourished, well developed in no acute distress HEENT: Normal NECK: No JVD; No carotid bruits CARDIAC: ***RRR, no murmurs, rubs, gallops RESPIRATORY:  Clear to auscultation without rales, wheezing or rhonchi  ABDOMEN: Soft, non-tender, non-distended MUSCULOSKELETAL:  No edema; No deformity. *** pedal pulses, ***bilaterally SKIN: Warm and dry NEUROLOGIC:  Alert and oriented x 3 PSYCHIATRIC:  Normal affect   EKG:  EKG is *** ordered today.  The ekg ordered today demonstrates ***  No BP recorded.  {Refresh Note OR Click here to enter BP  :1}***    Diagnoses:    No diagnosis found. Assessment and Plan:       {Are you ordering a CV Procedure (e.g. stress test, cath, DCCV, TEE, etc)?   Press F2        :[id number]}   Disposition:  Medication Adjustments/Labs and Tests Ordered: Current medicines are reviewed at length with the patient today.  Concerns regarding medicines are outlined above.  No orders of the defined types were placed in this encounter.  No orders of the defined types were placed in this encounter.   There are no Patient Instructions on file for this visit.   Signed, Levi Aland, NP  03/27/2022 10:59 AM     [facility]

## 2022-04-01 ENCOUNTER — Ambulatory Visit: Payer: Medicare PPO | Attending: Nurse Practitioner | Admitting: Nurse Practitioner

## 2022-07-28 NOTE — Progress Notes (Addendum)
Cardiology Office Note:    Date:  07/30/2022   ID:  Tristan Figueroa, DOB 1955/09/24, MRN [id number]  PCP:  [facility], [facility]   [facility] Providers Cardiologist:  Lance Muss, MD     Referring MD: [facility]   Chief Complaint: coronary artery disease  History of Present Illness:    Tristan Figueroa is a very pleasant 67 y.o. male with a hx of diabetes, HTN, aortic atherosclerosis, tobacco abuse, and HLD.  Referred to cardiology by [facility] clinic for evaluation of shortness of breath and seen by Dr. Eldridge Dace on 02/18/2022.  Had been seen in ER on November 18 for shortness of breath.  Records show "presents with 1 week of worsening shortness of breath.  He says he most notices it when he wakes up in the morning and sometimes with exertion."  [facility].  He denied chest pain, cough, cold symptoms, abdominal pain, n/v, numbness, weakness, change in urination, leg swelling, leg pain.  BNP was 472, troponin was negative.  No evidence of pulmonary embolism on chest CT.  He had diffuse groundglass opacities with interspersed interlobular septal thickening and a pattern most consistent with pulmonary edema although atypical pneumonia could have a similar appearance, small bilateral pleural effusions right greater than left, diffuse predominantly subpleural and para fistular nodules measuring up to 5 mm in the middle lobe, no follow-up needed if patient is at low risk.  He had good response to IV Lasix in the ER. At office visit,   At office visit 02/18/22, he had been taking Lasix 20 mg twice daily.  He reported walking 3 miles daily.  No prior cardiac issues or tests.  He was on low-dose losartan and low-dose statin.  Lab results from office visit revealed proBNP 408, A1C 10.0, stable renal function.  Echocardiogram was ordered for concern for right heart dysfunction, however there is no record that it has been  completed.  Today, he is here with his wife for follow-up. When asked about getting echo, he reports he was advised to get testing at [facility]. Per patient report, had concerning results and was referred back to [place]. We were supposed to receive copies of tests. We called the nurse coordinator at the [facility] who advised patient had diagnostic cardiac catheterization recently which revealed need for coronary intervention. Unfortunately we do not have these tests in hand today. He reports he has been feeling well and has not had any chest pain, shortness of breath, activity intolerance, palpitations, orthopnea, PND, edema, syncope.  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  He does not exercise on a consistent basis.  Unfortunately, he continues to smoke. Has started trying to eat a healthier diet, less red meat and more fruits and vegetables.   Past Medical History:  Diagnosis Date   Diabetes mellitus    type 2   Hyperlipidemia    Hypertension    SOB (shortness of breath)     Past Surgical History:  Procedure Laterality Date   ORIF CALCANEOUS FRACTURE  11/25/2011   Procedure: OPEN REDUCTION INTERNAL FIXATION (ORIF) CALCANEOUS FRACTURE;  Surgeon: Nadara Mustard, MD;  Location: [facility] OR;  Service: Orthopedics;  Laterality: Left;  Open Reduction Internal Fixation Left Calcaneous    Current Medications: Current Meds  Medication Sig   acetaminophen (TYLENOL) 500 MG tablet Take 1,000 mg by mouth every 6 (six) hours  as needed. For pain.   aspirin EC 81 MG tablet Take 81 mg by mouth daily.   carvedilol (COREG) 6.25 MG tablet Take 6.25 mg by mouth once.   ergocalciferol (VITAMIN D2) 50000 UNITS capsule Take 1 capsule (50,000 Units total) by mouth once a week.   glimepiride (AMARYL) 2 MG tablet Take 1 tablet (2 mg total) by mouth daily before breakfast.   HYDROPHILIC EX APPLY THIN LAYER TO AFFECTED AREA TWICE A DAY TO DRY SKIN ON FEET   insulin  glargine-yfgn (SEMGLEE) 100 UNIT/ML injection INJECT 34 UNITS SUBCUTANEOUSLY AT BEDTIME FOR DIABETES (USE WITHIN 28 DAYS AFTER OPENING VIAL) (CONVERTED FROM LANTUS)   metFORMIN (GLUCOPHAGE) 500 MG tablet Take 500 mg by mouth daily with breakfast.   rosuvastatin (CRESTOR) 20 MG tablet Take 20 mg by mouth daily.   sitaGLIPtin (JANUVIA) 100 MG tablet Take 1 tablet (100 mg total) by mouth daily.   [DISCONTINUED] losartan (COZAAR) 25 MG tablet Take 1 tablet (25 mg total) by mouth daily.   [DISCONTINUED] metformin (FORTAMET) 500 MG (OSM) 24 hr tablet Take 500 mg by mouth once.     Allergies:   Metformin hcl and Shrimp extract   Social History   Socioeconomic History   Marital status: Married    Spouse name: Not on file   Number of children: 2   Years of education: Not on file   Highest education level: Not on file  Occupational History   Occupation: INSTALLER    Employer: [employer]  Tobacco Use   Smoking status: Every Day    Packs/day: 0.50    Years: 1.00    Additional pack years: 0.00    Total pack years: 0.50    Types: Cigarettes   Smokeless tobacco: Never  Substance and Sexual Activity   Alcohol use: No   Drug use: No   Sexual activity: Not on file  Other Topics Concern   Not on file  Social History Narrative   Active at work, no routine exercise   diet: tries to eat well-healthy   Social Determinants of Health   Financial Resource Strain: Not on file  Food Insecurity: Not on file  Transportation Needs: Not on file  Physical Activity: Not on file  Stress: Not on file  Social Connections: Not on file     Family History: The patient's family history includes Dementia in his mother; Diabetes in his mother; Hypertension in his mother. There is no history of Coronary artery disease, Colon cancer, or Prostate cancer.  ROS:   Please see the history of present illness.   All other systems reviewed and are negative.  Labs/Other Studies Reviewed:    The following  studies were reviewed today:   Recent Labs: 02/14/2022: B Natriuretic Peptide 472.5; Hemoglobin 12.7; Platelets 214 02/18/2022: BUN 16; Creatinine, Ser 1.02; NT-Pro BNP 408; Potassium 3.9; Sodium 138  Recent Lipid Panel    Component Value Date/Time   CHOL 205 (H) 07/01/2012 0842   TRIG 57.0 07/01/2012 0842   TRIG 72 04/06/2006 0926   HDL 49.80 07/01/2012 0842   CHOLHDL 4 07/01/2012 0842   VLDL 11.4 07/01/2012 0842   LDLCALC 131 (H) 01/07/2011 1110   LDLDIRECT 143.4 07/01/2012 0842     Risk Assessment/Calculations:           Physical Exam:    VS:  BP 138/82   Pulse 85   Ht 5' 7.5" (1.715 m)   Wt 176 lb 3.2 oz (79.9 kg)   SpO2  97%   BMI 27.19 kg/m     Wt Readings from Last 3 Encounters:  07/30/22 176 lb 3.2 oz (79.9 kg)  02/18/22 182 lb (82.6 kg)  07/01/12 177 lb (80.3 kg)     GEN:  Well nourished, well developed in no acute distress HEENT: Normal NECK: No JVD; No carotid bruits CARDIAC: RRR, no murmurs, rubs, gallops RESPIRATORY:  Clear to auscultation without rales, wheezing or rhonchi  ABDOMEN: Soft, non-tender, non-distended MUSCULOSKELETAL:  No edema; No deformity. 2+ pedal pulses, equal bilaterally SKIN: Warm and dry NEUROLOGIC:  Alert and oriented x 3 PSYCHIATRIC:  Normal affect   EKG:  EKG is not ordered today.     Diagnoses:    1. Aortic atherosclerosis (HCC)   2. Essential hypertension   3. Coronary artery disease involving native coronary artery of native heart without angina pectoris   4. Tobacco abuse   5. Hyperlipidemia LDL goal <70    Assessment and Plan:     CAD without angina: Per patient report he has coronary artery disease found on diagnostic catheterization at the [place].  We do not have these test to review.  We discussed indications for cardiac catheterization in the event that this is the recommended procedure. Risks reviewed and he agrees to undergo cardiac catheterization if indicated. He is currently asymptomatic. Will review  cardiac cath and consult with Dr. Eldridge Dace once results are received.  He is on GDMT including aspirin, losartan, carvedilol, rosuvastatin.  Aortic atherosclerosis: Previously seen on imaging.   Tobacco abuse: Complete cessation advised. I offered nicotine patches, but he thinks he can quit on his own.  Hypertension: BP is well controlled.   Hyperlipidemia: No recent LDL to review.  Continue rosuvastatin 20 mg daily. Awaiting reports from [place] to review, including recent lab results.   Shared Decision Making/Informed Consent The risks [stroke (1 in 1000), death (1 in 1000), kidney failure [usually temporary] (1 in 500), bleeding (1 in 200), allergic reaction [possibly serious] (1 in 200)], benefits (diagnostic support and management of coronary artery disease) and alternatives of a cardiac catheterization were discussed in detail with Tristan Figueroa and he is willing to proceed.   Addendum: Records and cath images reviewed by Dr. Eldridge Dace.  We will proceed with scheduling cardiac catheterization at [facility] on 08/12/2022 with Dr. Eldridge Dace  Disposition: Awaiting test results from [place], return in 6-8 weeks  Medication Adjustments/Labs and Tests Ordered: Current medicines are reviewed at length with the patient today.  Concerns regarding medicines are outlined above.  Orders Placed This Encounter  Procedures   CBC   Basic metabolic panel   No orders of the defined types were placed in this encounter.   Patient Instructions  Medication Instructions:  Your physician recommends that you continue on your current medications as directed. Please refer to the Current Medication list given to you today.  *If you need a refill on your cardiac medications before your next appointment, please call your pharmacy*   Lab Work: BMET, CBC If you have labs (blood work) drawn today and your tests are completely normal, you will receive your results only by: MyChart Message (if you have MyChart) OR A paper copy  in the mail If you have any lab test that is abnormal or we need to change your treatment, we will call you to review the results.   Testing/Procedures: Your physician has requested that you have a cardiac catheterization. Cardiac catheterization is used to diagnose and/or treat various heart conditions. Doctors may recommend this  procedure for a number of different reasons. The most common reason is to evaluate chest pain. Chest pain can be a symptom of coronary artery disease (CAD), and cardiac catheterization can show whether plaque is narrowing or blocking your heart's arteries. This procedure is also used to evaluate the valves, as well as measure the blood flow and oxygen levels in different parts of your heart. For further information please visit [email or web address] Please follow instruction sheet, as given.    Follow-Up: At [facility], you and your health needs are our priority.  As part of our continuing mission to provide you with exceptional heart care, we have created designated Provider Care Teams.  These Care Teams include your primary Cardiologist (physician) and Advanced Practice Providers (APPs -  Physician Assistants and Nurse Practitioners) who all work together to provide you with the care you need, when you need it.  We recommend signing up for the patient portal called "MyChart".  Sign up information is provided on this After Visit Summary.  MyChart is used to connect with patients for Virtual Visits (Telemedicine).  Patients are able to view lab/test results, encounter notes, upcoming appointments, etc.  Non-urgent messages can be sent to your provider as well.   To learn more about what you can do with MyChart, go to [email or web address].    Your next appointment:   6 -8 week(s)  Provider:   Lance Muss, MD  or Eligha Bridegroom, NP      Signed, Levi Aland, NP  07/30/2022 6:10 PM    [facility]

## 2022-07-28 NOTE — H&P (View-Only) (Signed)
 Cardiology Office Note:    Date:  07/30/2022   ID:  Tristan Figueroa, DOB 01/17/1956, MRN [id number]  PCP:  [facility], [facility]   [facility] Providers Cardiologist:  Jayadeep Varanasi, MD     Referring MD: [facility], [facility]   Chief Complaint: coronary artery disease  History of Present Illness:    Tristan Figueroa is a very pleasant 67 y.o. male with a hx of diabetes, HTN, aortic atherosclerosis, tobacco abuse, and HLD.  [facility] and seen by Dr. Varanasi on 02/18/2022.  Had been seen in ER on November 18 for shortness of breath.  Records show "presents with 1 week of worsening shortness of breath.  He says he most notices it when he wakes up in the morning and sometimes with exertion."  [facility] and [facility] asked him to come to ED for evaluation.  He denied chest pain, cough, cold symptoms, abdominal pain, n/v, numbness, weakness, change in urination, leg swelling, leg pain.  BNP was 472, troponin was negative.  No evidence of pulmonary embolism on chest CT.  He had diffuse groundglass opacities with interspersed interlobular septal thickening and a pattern most consistent with pulmonary edema although atypical pneumonia could have a similar appearance, small bilateral pleural effusions right greater than left, diffuse predominantly subpleural and para fistular nodules measuring up to 5 mm in the middle lobe, no follow-up needed if patient is at low risk.  He had good response to IV Lasix in the ER. At office visit,   At office visit 02/18/22, he had been taking Lasix 20 mg twice daily.  He reported walking 3 miles daily.  No prior cardiac issues or tests.  He was on low-dose losartan and low-dose statin.  Lab results from office visit revealed proBNP 408, A1C 10.0, stable renal function.  Echocardiogram was ordered for concern for right heart dysfunction, however there is no record that it has been  completed.  Today, he is here with his wife for follow-up. When asked about getting echo, he reports he was [facility]. Per patient report, had concerning results and was referred back to us. We were supposed to receive copies of tests. We called [facility] the [facility] who advised patient had diagnostic cardiac catheterization recently which revealed need for coronary intervention. Unfortunately we do not have these tests in hand today. He reports he has been feeling well and has not had any chest pain, shortness of breath, activity intolerance, palpitations, orthopnea, PND, edema, syncope.  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  He does not exercise on a consistent basis.  Unfortunately, he continues to smoke. Has started trying to eat a healthier diet, less red meat and more fruits and vegetables.   Past Medical History:  Diagnosis Date   Diabetes mellitus    type 2   Hyperlipidemia    Hypertension    SOB (shortness of breath)     Past Surgical History:  Procedure Laterality Date   ORIF CALCANEOUS FRACTURE  11/25/2011   Procedure: OPEN REDUCTION INTERNAL FIXATION (ORIF) CALCANEOUS FRACTURE;  Surgeon: Marcus V Duda, MD;  Location: [facility] OR;  Service: Orthopedics;  Laterality: Left;  Open Reduction Internal Fixation Left Calcaneous    Current Medications: Current Meds  Medication Sig   acetaminophen (TYLENOL) 500 MG tablet Take 1,000 mg by mouth every 6 (six) hours   as needed. For pain.   aspirin EC 81 MG tablet Take 81 mg by mouth daily.   carvedilol (COREG) 6.25 MG tablet Take 6.25 mg by mouth once.   ergocalciferol (VITAMIN D2) 50000 UNITS capsule Take 1 capsule (50,000 Units total) by mouth once a week.   glimepiride (AMARYL) 2 MG tablet Take 1 tablet (2 mg total) by mouth daily before breakfast.   HYDROPHILIC EX APPLY THIN LAYER TO AFFECTED AREA TWICE A DAY TO DRY SKIN ON FEET   insulin  glargine-yfgn (SEMGLEE) 100 UNIT/ML injection INJECT 34 UNITS SUBCUTANEOUSLY AT BEDTIME FOR DIABETES (USE WITHIN 28 DAYS AFTER OPENING VIAL) (CONVERTED FROM LANTUS)   metFORMIN (GLUCOPHAGE) 500 MG tablet Take 500 mg by mouth daily with breakfast.   rosuvastatin (CRESTOR) 20 MG tablet Take 20 mg by mouth daily.   sitaGLIPtin (JANUVIA) 100 MG tablet Take 1 tablet (100 mg total) by mouth daily.   [DISCONTINUED] losartan (COZAAR) 25 MG tablet Take 1 tablet (25 mg total) by mouth daily.   [DISCONTINUED] metformin (FORTAMET) 500 MG (OSM) 24 hr tablet Take 500 mg by mouth once.     Allergies:   Metformin hcl and Shrimp extract   Social History   Socioeconomic History   Marital status: Married    Spouse name: Not on file   Number of children: 2   Years of education: Not on file   Highest education level: Not on file  Occupational History   Occupation: INSTALLER    Employer: [employer]  Tobacco Use   Smoking status: Every Day    Packs/day: 0.50    Years: 1.00    Additional pack years: 0.00    Total pack years: 0.50    Types: Cigarettes   Smokeless tobacco: Never  Substance and Sexual Activity   Alcohol use: No   Drug use: No   Sexual activity: Not on file  Other Topics Concern   Not on file  Social History Narrative   Active at work, no routine exercise   diet: tries to eat well-healthy   Social Determinants of Health   Financial Resource Strain: Not on file  Food Insecurity: Not on file  Transportation Needs: Not on file  Physical Activity: Not on file  Stress: Not on file  Social Connections: Not on file     Family History: The patient's family history includes Dementia in his mother; Diabetes in his mother; Hypertension in his mother. There is no history of Coronary artery disease, Colon cancer, or Prostate cancer.  ROS:   Please see the history of present illness.   All other systems reviewed and are negative.  Labs/Other Studies Reviewed:    The following  studies were reviewed today:   Recent Labs: 02/14/2022: B Natriuretic Peptide 472.5; Hemoglobin 12.7; Platelets 214 02/18/2022: BUN 16; Creatinine, Ser 1.02; NT-Pro BNP 408; Potassium 3.9; Sodium 138  Recent Lipid Panel    Component Value Date/Time   CHOL 205 (H) 07/01/2012 0842   TRIG 57.0 07/01/2012 0842   TRIG 72 04/06/2006 0926   HDL 49.80 07/01/2012 0842   CHOLHDL 4 07/01/2012 0842   VLDL 11.4 07/01/2012 0842   LDLCALC 131 (H) 01/07/2011 1110   LDLDIRECT 143.4 07/01/2012 0842     Risk Assessment/Calculations:           Physical Exam:    VS:  BP 138/82   Pulse 85   Ht 5' 7.5" (1.715 m)   Wt 176 lb 3.2 oz (79.9 kg)   SpO2   97%   BMI 27.19 kg/m     Wt Readings from Last 3 Encounters:  07/30/22 176 lb 3.2 oz (79.9 kg)  02/18/22 182 lb (82.6 kg)  07/01/12 177 lb (80.3 kg)     GEN:  Well nourished, well developed in no acute distress HEENT: Normal NECK: No JVD; No carotid bruits CARDIAC: RRR, no murmurs, rubs, gallops RESPIRATORY:  Clear to auscultation without rales, wheezing or rhonchi  ABDOMEN: Soft, non-tender, non-distended MUSCULOSKELETAL:  No edema; No deformity. 2+ pedal pulses, equal bilaterally SKIN: Warm and dry NEUROLOGIC:  Alert and oriented x 3 PSYCHIATRIC:  Normal affect   EKG:  EKG is not ordered today.     Diagnoses:    1. Aortic atherosclerosis (HCC)   2. Essential hypertension   3. Coronary artery disease involving native coronary artery of native heart without angina pectoris   4. Tobacco abuse   5. Hyperlipidemia LDL goal <70    Assessment and Plan:     CAD without angina: Per patient report he has coronary artery disease [facility].  We do not have these test to review.  We discussed indications for cardiac catheterization in the event that this is the recommended procedure. Risks reviewed and he agrees to undergo cardiac catheterization if indicated. He is currently asymptomatic. Will review  cardiac cath and consult with Dr. Varanasi once results are received.  He is on GDMT including aspirin, losartan, carvedilol, rosuvastatin.  Aortic atherosclerosis: Previously seen on imaging.   Tobacco abuse: Complete cessation advised. I offered nicotine patches, but he thinks he can quit on his own.  Hypertension: BP is well controlled.   Hyperlipidemia: No recent LDL to review.  Continue rosuvastatin 20 mg daily. Awaiting reports from [facility] to review, including recent lab results.   Shared Decision Making/Informed Consent The risks [stroke (1 in 1000), death (1 in 1000), kidney failure [usually temporary] (1 in 500), bleeding (1 in 200), allergic reaction [possibly serious] (1 in 200)], benefits (diagnostic support and management of coronary artery disease) and alternatives of a cardiac catheterization were discussed in detail with Mr. Stansbery and he is willing to proceed.   Addendum: Records and cath images reviewed by Dr. Varanasi.  We will proceed with scheduling cardiac catheterization at [facility] on 08/12/2022 with Dr. Varanasi  Disposition: [facility]waiting [facility] results from [facility], return in 6-8 weeks  Medication Adjustments/Labs and Tests Ordered: Current medicines are reviewed at length with the patient today.  Concerns regarding medicines are outlined above.  Orders Placed This Encounter  Procedures   CBC   Basic metabolic panel   No orders of the defined types were placed in this encounter.   Patient Instructions  Medication Instructions:  Your physician recommends that you continue on your current medications as directed. Please refer to the Current Medication list given to you today.  *If you need a refill on your cardiac medications before your next appointment, please call your pharmacy*   Lab Work: BMET, CBC If you have labs (blood work) drawn today and your tests are completely normal, you will receive your results only by: MyChart Message (if you have MyChart) OR A paper copy  in the mail If you have any lab test that is abnormal or we need to change your treatment, we will call you to review the results.   Testing/Procedures: Your physician has requested that you have a cardiac catheterization. Cardiac catheterization is used to diagnose and/or treat various heart conditions. Doctors may recommend this   procedure for a number of different reasons. The most common reason is to evaluate chest pain. Chest pain can be a symptom of coronary artery disease (CAD), and cardiac catheterization can show whether plaque is narrowing or blocking your heart's arteries. This procedure is also used to evaluate the valves, as well as measure the blood flow and oxygen levels in different parts of your heart. For further information please visit www.cardiosmart.org. Please follow instruction sheet, as given.    Follow-Up: At [facility], you and your health needs are our priority.  As part of our continuing mission to provide you with exceptional heart care, we have created designated Provider Care Teams.  These Care Teams include your primary Cardiologist (physician) and Advanced Practice Providers (APPs -  Physician Assistants and Nurse Practitioners) who all work together to provide you with the care you need, when you need it.  We recommend signing up for the patient portal called "MyChart".  Sign up information is provided on this After Visit Summary.  MyChart is used to connect with patients for Virtual Visits (Telemedicine).  Patients are able to view lab/test results, encounter notes, upcoming appointments, etc.  Non-urgent messages can be sent to your provider as well.   To learn more about what you can do with MyChart, go to [email or web address].    Your next appointment:   6 -8 week(s)  Provider:   Jayadeep Varanasi, MD  or Matson Welch, NP      Signed, Kylian Loh M, NP  07/30/2022 6:10 PM    [facility] 

## 2022-07-30 ENCOUNTER — Ambulatory Visit: Payer: No Typology Code available for payment source | Attending: Nurse Practitioner | Admitting: Nurse Practitioner

## 2022-07-30 ENCOUNTER — Encounter: Payer: Self-pay | Admitting: Nurse Practitioner

## 2022-07-30 VITALS — BP 138/82 | HR 85 | Ht 67.5 in | Wt 176.2 lb

## 2022-07-30 DIAGNOSIS — Z720 Tobacco use: Secondary | ICD-10-CM | POA: Diagnosis not present

## 2022-07-30 DIAGNOSIS — I2510 Atherosclerotic heart disease of native coronary artery without angina pectoris: Secondary | ICD-10-CM | POA: Diagnosis not present

## 2022-07-30 DIAGNOSIS — I10 Essential (primary) hypertension: Secondary | ICD-10-CM

## 2022-07-30 DIAGNOSIS — I700 Atherosclerosis of aorta: Secondary | ICD-10-CM | POA: Diagnosis not present

## 2022-07-30 DIAGNOSIS — E785 Hyperlipidemia, unspecified: Secondary | ICD-10-CM

## 2022-07-30 NOTE — Patient Instructions (Signed)
Medication Instructions:  Your physician recommends that you continue on your current medications as directed. Please refer to the Current Medication list given to you today.  *If you need a refill on your cardiac medications before your next appointment, please call your pharmacy*   Lab Work: BMET, CBC If you have labs (blood work) drawn today and your tests are completely normal, you will receive your results only by: MyChart Message (if you have MyChart) OR A paper copy in the mail If you have any lab test that is abnormal or we need to change your treatment, we will call you to review the results.   Testing/Procedures: Your physician has requested that you have a cardiac catheterization. Cardiac catheterization is used to diagnose and/or treat various heart conditions. Doctors may recommend this procedure for a number of different reasons. The most common reason is to evaluate chest pain. Chest pain can be a symptom of coronary artery disease (CAD), and cardiac catheterization can show whether plaque is narrowing or blocking your heart's arteries. This procedure is also used to evaluate the valves, as well as measure the blood flow and oxygen levels in different parts of your heart. For further information please visit [email or web address] Please follow instruction sheet, as given.    Follow-Up: At [facility], you and your health needs are our priority.  As part of our continuing mission to provide you with exceptional heart care, we have created designated Provider Care Teams.  These Care Teams include your primary Cardiologist (physician) and Advanced Practice Providers (APPs -  Physician Assistants and Nurse Practitioners) who all work together to provide you with the care you need, when you need it.  We recommend signing up for the patient portal called "MyChart".  Sign up information is provided on this After Visit Summary.  MyChart is used to connect with patients for  Virtual Visits (Telemedicine).  Patients are able to view lab/test results, encounter notes, upcoming appointments, etc.  Non-urgent messages can be sent to your provider as well.   To learn more about what you can do with MyChart, go to [email or web address].    Your next appointment:   6 -8 week(s)  Provider:   Lance Muss, MD  or Eligha Bridegroom, NP

## 2022-07-31 ENCOUNTER — Telehealth: Payer: Self-pay

## 2022-07-31 LAB — BASIC METABOLIC PANEL
BUN/Creatinine Ratio: 20 (ref 10–24)
BUN: 18 mg/dL (ref 8–27)
CO2: 20 mmol/L (ref 20–29)
Calcium: 9.2 mg/dL (ref 8.6–10.2)
Chloride: 109 mmol/L — ABNORMAL HIGH (ref 96–106)
Creatinine, Ser: 0.9 mg/dL (ref 0.76–1.27)
Glucose: 169 mg/dL — ABNORMAL HIGH (ref 70–99)
Potassium: 4.3 mmol/L (ref 3.5–5.2)
Sodium: 144 mmol/L (ref 134–144)
eGFR: 94 mL/min/{1.73_m2} (ref >59)

## 2022-07-31 LAB — CBC
Hematocrit: 41.5 % (ref 37.5–51.0)
Hemoglobin: 13 g/dL (ref 13.0–17.7)
MCH: 20.8 pg — ABNORMAL LOW (ref 26.6–33.0)
MCHC: 31.3 g/dL — ABNORMAL LOW (ref 31.5–35.7)
MCV: 66 fL — ABNORMAL LOW (ref 79–97)
Platelets: 218 10*3/uL (ref 150–450)
RBC: 6.26 x10E6/uL — ABNORMAL HIGH (ref 4.14–5.80)
RDW: 18.2 % — ABNORMAL HIGH (ref 11.6–15.4)
WBC: 9.4 10*3/uL (ref 3.4–10.8)

## 2022-07-31 NOTE — Telephone Encounter (Signed)
-----   Message from Levi Aland, NP sent at 07/31/2022  5:41 AM EDT ----- Kidney function and electrolytes are stable. Blood counts are stable. No changes to current plan and we continue to wait for records from [place] to determine if cardiac catheterization is needed.

## 2022-07-31 NOTE — Telephone Encounter (Signed)
Patient aware of lab results and provider recommendations. Verbalized understanding

## 2022-08-03 ENCOUNTER — Telehealth: Payer: Self-pay | Admitting: Interventional Cardiology

## 2022-08-03 ENCOUNTER — Telehealth: Payer: Self-pay | Admitting: Nurse Practitioner

## 2022-08-03 NOTE — Telephone Encounter (Signed)
Darlin Drop is calling from the [place] stating she sent a disc by mail of the patient's heart caths that shows through [employer] was delivered to the front desk 05/02 around 7:40 am, but does not show was discussed at his appt on 05/02.   She is wanting to know if this was added to the patient's chart and reviewed by a provider. She reports she put it to the attention of Rose. Please advise.

## 2022-08-03 NOTE — Telephone Encounter (Signed)
Returned call to patient and shared Eligha Bridegroom, NP's response:  Please let them know that we received the disc and I have sent that to Dr. Eldridge Dace for review. Someone from our office will be back in touch when he has reviewed the images.   Patient verbalized understanding and expressed appreciation for follow-up.

## 2022-08-03 NOTE — Telephone Encounter (Signed)
Pt is requesting a callback from [facility] regarding upcoming procedure. Please advise

## 2022-08-03 NOTE — Telephone Encounter (Signed)
Please let them know that we received the disc and I have sent that to Dr. Eldridge Dace for review. Someone from our office will be back in touch when he has reviewed the images.

## 2022-08-03 NOTE — Telephone Encounter (Signed)
Patient is calling to confirm if the [place] referral to give PA for his upcoming appointment has been received. Please advise.

## 2022-08-03 NOTE — Telephone Encounter (Signed)
Pt would like a callback from nurse regarding call from earlier. Please advise

## 2022-08-03 NOTE — Telephone Encounter (Signed)
Pt called asking when will someone call to schedule his cardiac craterization.  Our office still has not received copy of pt test from the [place]. Will forward to APP.

## 2022-08-03 NOTE — Telephone Encounter (Signed)
Caller is following-up noting a disk with patient's cardiac cath images from 4/23 was sent to medical records.  Caller stated she can be contacted at phone# [phone] for any further information.

## 2022-08-05 ENCOUNTER — Encounter: Payer: Self-pay | Admitting: *Deleted

## 2022-08-05 NOTE — Telephone Encounter (Signed)
Cath instructions reviewed with patient.  Copy of instructions left at front desk for him to pick up

## 2022-08-05 NOTE — Telephone Encounter (Signed)
Received message from Lebron Conners that patient needs to be set up for cath/probable PCI.  Procedure scheduled for Aug 12, 2022 at 8:30.  Arrival time is 6:30.  EKG to be done upon arrival to hospital for procedure.  Labs done 5/2.   I placed call to patient and left message to call office.

## 2022-08-05 NOTE — Addendum Note (Signed)
Addended by: Levi Aland on: 08/05/2022 10:16 AM   Modules accepted: Orders

## 2022-08-06 ENCOUNTER — Telehealth: Payer: Self-pay | Admitting: *Deleted

## 2022-08-06 NOTE — Telephone Encounter (Signed)
Call placed to patient to discuss starting Plavix prior to procedure 08/12/22.  Per Dr Eldridge Dace: Please give plavix 300 mg x 1, followed by 75 mg daily.   I reviewed indications and instructions for starting Plavix with patient. Patient instructed to take Plavix 300 mg x 1, followed by 75 mg daily.  Patient requests to get prescription from [facility] and asked that I call Leanora Cover, Cardiology Navigator,  [place] [phone] ext [phone]. I spoke with Leanora Cover, gave her prescription for Plavix 300 mg x 1, followed by Plavix 75 mg daily per Dr Eldridge Dace. Per Lynnae Sandhoff- [place] doctor will prescribe for patient  and patient will pick up at [facility] window. Patient is aware he can pick up Plavix prescription at [facility].

## 2022-08-06 NOTE — Telephone Encounter (Signed)
-----   Message from Corky Crafts, MD sent at 08/06/2022 11:31 AM EDT ----- Regarding: RE: LHC/poss PCI 08/12/22 Yes, Please give plavix 300 mg x 1, followed by 75 mg daily ----- Message ----- From: Jacqlyn Krauss, RN Sent: 08/06/2022   8:40 AM EDT To: Corky Crafts, MD; Jacqlyn Krauss, RN Subject: LHC/poss PCI 08/12/22                           Hi Dr Eldridge Dace,  Do you want this patient to start Plavix before LHC/poss PCI 08/12/22?  Thanks, Thurston Hole

## 2022-08-10 ENCOUNTER — Telehealth: Payer: Self-pay | Admitting: *Deleted

## 2022-08-10 NOTE — Telephone Encounter (Signed)
Dr Eldridge Dace has disc of cath done recently at [facility].

## 2022-08-10 NOTE — Telephone Encounter (Signed)
Cardiac Catheterization/+/- PCI scheduled at [facility] for: Wednesday Aug 12, 2022 8:30 AM Arrival time [facility] Main Entrance A at: 6:30 AM  Nothing to eat after midnight prior to procedure, clear liquids until 5 AM day of procedure.  Medication instructions: -Hold:  Metformin-day of procedure and 48 hours post procedure  Januvia/Glimepiride-AM of procedure  Insulin-1/2 usual dose HS prior to procedure  Patient reports he does not take Insulin in the AM  Lasix -AM of procedure -Other usual morning medications can be taken with sips of water including aspirin 81 mg and Plavix 75 mg.  Confirmed patient has responsible adult to drive home post procedure and be with patient first 24 hours after arriving home.  Plan to go home the same day, you will only stay overnight if medically necessary.  Reviewed procedure instructions with patient.

## 2022-08-10 NOTE — Telephone Encounter (Signed)
Patient reports he did get prescription for Plavix and started taking Plavix 08/07/22-Plavix 300 mg x 1, then 75 mg daily.

## 2022-08-12 ENCOUNTER — Ambulatory Visit (HOSPITAL_COMMUNITY)
Admission: RE | Admit: 2022-08-12 | Discharge: 2022-08-12 | Disposition: A | Discharge status: Home / Self Care | Payer: No Typology Code available for payment source | Attending: Interventional Cardiology | Admitting: Interventional Cardiology

## 2022-08-12 ENCOUNTER — Other Ambulatory Visit (HOSPITAL_COMMUNITY): Payer: Self-pay

## 2022-08-12 ENCOUNTER — Telehealth: Payer: Self-pay | Admitting: Cardiology

## 2022-08-12 ENCOUNTER — Encounter (HOSPITAL_COMMUNITY)
Admission: RE | Disposition: A | Discharge status: Home / Self Care | Payer: Self-pay | Source: Home / Self Care | Attending: Interventional Cardiology

## 2022-08-12 DIAGNOSIS — I25118 Atherosclerotic heart disease of native coronary artery with other forms of angina pectoris: Secondary | ICD-10-CM | POA: Insufficient documentation

## 2022-08-12 DIAGNOSIS — I5022 Chronic systolic (congestive) heart failure: Secondary | ICD-10-CM | POA: Insufficient documentation

## 2022-08-12 DIAGNOSIS — I2510 Atherosclerotic heart disease of native coronary artery without angina pectoris: Secondary | ICD-10-CM

## 2022-08-12 DIAGNOSIS — E118 Type 2 diabetes mellitus with unspecified complications: Secondary | ICD-10-CM | POA: Diagnosis present

## 2022-08-12 DIAGNOSIS — E785 Hyperlipidemia, unspecified: Secondary | ICD-10-CM | POA: Diagnosis present

## 2022-08-12 DIAGNOSIS — I10 Essential (primary) hypertension: Secondary | ICD-10-CM | POA: Diagnosis present

## 2022-08-12 DIAGNOSIS — Z955 Presence of coronary angioplasty implant and graft: Secondary | ICD-10-CM

## 2022-08-12 DIAGNOSIS — F17200 Nicotine dependence, unspecified, uncomplicated: Secondary | ICD-10-CM | POA: Diagnosis present

## 2022-08-12 HISTORY — PX: LEFT HEART CATH AND CORONARY ANGIOGRAPHY: CATH118249

## 2022-08-12 HISTORY — PX: CORONARY STENT INTERVENTION: CATH118234

## 2022-08-12 LAB — GLUCOSE, CAPILLARY
Glucose-Capillary: 121 mg/dL — ABNORMAL HIGH (ref 70–99)
Glucose-Capillary: 96 mg/dL (ref 70–99)

## 2022-08-12 LAB — POCT ACTIVATED CLOTTING TIME: Activated Clotting Time: 553 seconds

## 2022-08-12 SURGERY — LEFT HEART CATH AND CORONARY ANGIOGRAPHY
Anesthesia: LOCAL

## 2022-08-12 MED ORDER — HEPARIN SODIUM (PORCINE) 1000 UNIT/ML IJ SOLN
INTRAMUSCULAR | Status: AC
  Filled 2022-08-12: qty 10

## 2022-08-12 MED ORDER — ASPIRIN 81 MG PO CHEW: 81.0000 mg | CHEWABLE_TABLET | ORAL | Status: DC

## 2022-08-12 MED ORDER — ONDANSETRON HCL 4 MG/2ML IJ SOLN: 4.0000 mg | Freq: Four times a day (QID) | INTRAMUSCULAR | Status: DC | PRN

## 2022-08-12 MED ORDER — ASPIRIN 81 MG PO CHEW: 81.0000 mg | CHEWABLE_TABLET | Freq: Every day | ORAL | Status: DC

## 2022-08-12 MED ORDER — FENTANYL CITRATE (PF) 100 MCG/2ML IJ SOLN
INTRAMUSCULAR | Status: AC
  Filled 2022-08-12: qty 2

## 2022-08-12 MED ORDER — LIDOCAINE HCL (PF) 1 % IJ SOLN
INTRAMUSCULAR | Status: DC | PRN
  Administered 2022-08-12: 2 mL via INTRADERMAL

## 2022-08-12 MED ORDER — FENTANYL CITRATE (PF) 100 MCG/2ML IJ SOLN
INTRAMUSCULAR | Status: DC | PRN
  Administered 2022-08-12 (×2): 25 ug via INTRAVENOUS

## 2022-08-12 MED ORDER — ACETAMINOPHEN 325 MG PO TABS: 650.0000 mg | ORAL_TABLET | ORAL | Status: DC | PRN

## 2022-08-12 MED ORDER — LABETALOL HCL 5 MG/ML IV SOLN: 10.0000 mg | INTRAVENOUS | Status: AC | PRN

## 2022-08-12 MED ORDER — VERAPAMIL HCL 2.5 MG/ML IV SOLN
INTRAVENOUS | Status: AC
  Filled 2022-08-12: qty 2

## 2022-08-12 MED ORDER — CLOPIDOGREL BISULFATE 75 MG PO TABS: 75.0000 mg | ORAL_TABLET | Freq: Every day | ORAL | Status: DC

## 2022-08-12 MED ORDER — NITROGLYCERIN 0.4 MG SL SUBL
0.4000 mg | SUBLINGUAL_TABLET | SUBLINGUAL | 3 refills | Status: AC | PRN
  Filled 2022-08-12: qty 25, 7d supply, fill #0

## 2022-08-12 MED ORDER — CARVEDILOL 3.125 MG PO TABS
3.1250 mg | ORAL_TABLET | Freq: Two times a day (BID) | ORAL | Status: DC
  Administered 2022-08-12: 3.125 mg via ORAL
  Filled 2022-08-12: qty 1

## 2022-08-12 MED ORDER — MIDAZOLAM HCL 2 MG/2ML IJ SOLN
INTRAMUSCULAR | Status: DC | PRN
  Administered 2022-08-12: 2 mg via INTRAVENOUS
  Administered 2022-08-12: 1 mg via INTRAVENOUS

## 2022-08-12 MED ORDER — SODIUM CHLORIDE 0.9% FLUSH: 3.0000 mL | INTRAVENOUS | Status: DC | PRN

## 2022-08-12 MED ORDER — HEPARIN (PORCINE) IN NACL 1000-0.9 UT/500ML-% IV SOLN
INTRAVENOUS | Status: DC | PRN
  Administered 2022-08-12 (×2): 500 mL

## 2022-08-12 MED ORDER — SODIUM CHLORIDE 0.9 % WEIGHT BASED INFUSION: 1.0000 mL/kg/h | INTRAVENOUS | Status: DC

## 2022-08-12 MED ORDER — SODIUM CHLORIDE 0.9% FLUSH: 3.0000 mL | Freq: Two times a day (BID) | INTRAVENOUS | Status: DC

## 2022-08-12 MED ORDER — MIDAZOLAM HCL 2 MG/2ML IJ SOLN
INTRAMUSCULAR | Status: AC
  Filled 2022-08-12: qty 2

## 2022-08-12 MED ORDER — NITROGLYCERIN 1 MG/10 ML FOR IR/CATH LAB
INTRA_ARTERIAL | Status: DC | PRN
  Administered 2022-08-12: 500 ug via INTRA_ARTERIAL
  Administered 2022-08-12 (×2): 200 ug via INTRACORONARY

## 2022-08-12 MED ORDER — CARVEDILOL 3.125 MG PO TABS
3.1250 mg | ORAL_TABLET | Freq: Two times a day (BID) | ORAL | 5 refills | Status: AC
  Filled 2022-08-12: qty 60, 30d supply, fill #0

## 2022-08-12 MED ORDER — SODIUM CHLORIDE 0.9 % IV SOLN: 250.0000 mL | INTRAVENOUS | Status: DC | PRN

## 2022-08-12 MED ORDER — SODIUM CHLORIDE 0.9 % WEIGHT BASED INFUSION
3.0000 mL/kg/h | INTRAVENOUS | Status: AC
  Administered 2022-08-12: 3 mL/kg/h via INTRAVENOUS

## 2022-08-12 MED ORDER — VERAPAMIL HCL 2.5 MG/ML IV SOLN
INTRAVENOUS | Status: DC | PRN
  Administered 2022-08-12: 10 mL via INTRA_ARTERIAL

## 2022-08-12 MED ORDER — HYDRALAZINE HCL 20 MG/ML IJ SOLN: 10.0000 mg | INTRAMUSCULAR | Status: AC | PRN

## 2022-08-12 MED ORDER — NITROGLYCERIN 1 MG/10 ML FOR IR/CATH LAB
INTRA_ARTERIAL | Status: AC
  Filled 2022-08-12: qty 10

## 2022-08-12 MED ORDER — IOHEXOL 350 MG/ML SOLN
INTRAVENOUS | Status: DC | PRN
  Administered 2022-08-12: 90 mL

## 2022-08-12 MED ORDER — LIDOCAINE HCL (PF) 1 % IJ SOLN
INTRAMUSCULAR | Status: AC
  Filled 2022-08-12: qty 30

## 2022-08-12 MED ORDER — HEPARIN SODIUM (PORCINE) 1000 UNIT/ML IJ SOLN
INTRAMUSCULAR | Status: DC | PRN
  Administered 2022-08-12: 4000 [IU] via INTRAVENOUS
  Administered 2022-08-12: 5000 [IU] via INTRAVENOUS

## 2022-08-12 SURGICAL SUPPLY — 20 items
BALLN WOLVERINE 2.50X15 (BALLOONS) ×1
BALLN ~~LOC~~ EMERGE MR 3.5X12 (BALLOONS) ×1
BALLOON WOLVERINE 2.50X15 (BALLOONS) IMPLANT
BALLOON ~~LOC~~ EMERGE MR 3.5X12 (BALLOONS) IMPLANT
CATH 5FR JL3.5 JR4 ANG PIG MP (CATHETERS) IMPLANT
CATH LAUNCHER 6FR EBU 3.5 SH (CATHETERS) IMPLANT
DEVICE RAD COMP TR BAND LRG (VASCULAR PRODUCTS) IMPLANT
GLIDESHEATH SLEND SS 6F .021 (SHEATH) IMPLANT
GUIDEWIRE INQWIRE 1.5J.035X260 (WIRE) IMPLANT
INQWIRE 1.5J .035X260CM (WIRE) ×1
KIT ENCORE 26 ADVANTAGE (KITS) IMPLANT
KIT HEART LEFT (KITS) ×1 IMPLANT
PACK CARDIAC CATHETERIZATION (CUSTOM PROCEDURE TRAY) ×1 IMPLANT
SHEATH PROBE COVER 6X72 (BAG) IMPLANT
STENT SYNERGY XD 3.0X32 (Permanent Stent) IMPLANT
TRANSDUCER W/STOPCOCK (MISCELLANEOUS) ×1 IMPLANT
TUBING CIL FLEX 10 FLL-RA (TUBING) ×1 IMPLANT
VALVE GUARDIAN II ~~LOC~~ HEMO (MISCELLANEOUS) IMPLANT
WIRE ASAHI PROWATER 180CM (WIRE) IMPLANT
WIRE RUNTHROUGH .014X180CM (WIRE) IMPLANT

## 2022-08-12 NOTE — Telephone Encounter (Signed)
   Transition of Care Follow-up Phone Call Request    Patient Name: Tristan Figueroa Date of Birth: October 25, 1955 Date of Encounter: 08/12/2022  Primary Care Provider:  [facility] Primary Cardiologist:  Lance Muss, MD  [name] has been scheduled for a transition of care follow up appointment with a [facility] provider: 08/25/22 with Eligha Bridegroom  Please reach out to [name] within 48 hours to confirm appointment.  Abagail Kitchens, PA-C  08/12/2022, 11:33 AM

## 2022-08-12 NOTE — Discharge Summary (Cosign Needed)
Discharge Summary for Same Day PCI   Patient ID: Tristan Figueroa MRN: [id number]; DOB: [birth year]  Admit date: 08/12/2022 Discharge date: 08/12/2022  Primary Care Provider: [facility]  Primary Cardiologist: Lance Muss, MD  Primary Electrophysiologist:  None   Discharge Diagnoses    Principal Problem:   CAD (coronary artery disease) Active Problems:   Type 2 diabetes mellitus with complication, without long-term current use of insulin (HCC)   Hyperlipidemia   TOBACCO ABUSE   HYPERTENSION, BENIGN ESSENTIAL   Chronic HFrEF (heart failure with reduced ejection fraction) [facility])    Diagnostic Studies/Procedures    Cardiac Catheterization 08/12/2022:   Mid LAD lesion is 90% stenosed.   A drug-eluting stent was successfully placed using a STENT SYNERGY XD 3.0X32 postdilated to greater than 3.5 mm.   Post intervention, there is a 0% residual stenosis.   There is moderate left ventricular systolic dysfunction.   LV end diastolic pressure is normal.   The left ventricular ejection fraction is 35-45% by visual estimate.   There is no aortic valve stenosis.   Continue dual antiplatelet therapy along with aggressive secondary prevention.  Continue medical therapy for LV dysfunction as well.   Plan for same day discharge. _____________   History of Present Illness     Tristan Figueroa is a 67 y.o. male with diabetes, HTN, aortic atherosclerosis, tobacco abuse, HLD, and recently diagnosed HFrEF with CAD at the [place]  Patient was initially referred by the [facility] clinic with complaints of exertional shortness of breath and had seen Dr. Eldridge Dace in November 2023. Echo was ordered but patient was lost to follow-up. He was recently asked to follow up with our office due to concerning results at the [facility] with recommendation for coronary intervention. At time of visit 07/30/22 the records were not yet received but further review now shows echo 05/2022 with EF 35-40%, global hypokinesis,  G2DD, mildly dilated LA, mild MR/TR. He underwent cath 06/2022 showing stenosis of the LAD, referred for coronary intervention. It was discussed and agreed upon to undergo cardiac catherization.   Hospital Course     The patient underwent cardiac cath as noted above with DES to the mid LAD. Plan for DAPT with ASA/Plavix 75mg  daily for at least 6 months. The patient was seen by cardiac rehab while in short stay. There were no observed complications post cath. R Radial cath site was re-evaluated prior to discharge and found to be stable without any complications. Instructions/precautions regarding cath site care were given prior to discharge.  Tarek Mclane was seen by Dr. Eldridge Dace and determined stable for discharge home. Follow up with our office has been arranged. Medications are listed below. Pertinent changes include adding carvedilol and SL NTG. Will need additional titration of GDMT as outpatient. Advised to hold metformin for at least 48 hours post-cath, to resume 08/15/22. Dr. Eldridge Dace has recommend patient stay out of work for 1 week. Patient is self employed, but is agreeable to plan. Cath site has been assessed and with no acute complications and minor swelling.   Patients blood pressure has been increasing since cath and around 190s systolic, baseline around 160s per patient. He will be getting 3.125mg  of carvedilol here before discharge. BP better controlled and around 150s.    _____________  Cath/PCI Registry Performance & Quality Measures: Aspirin prescribed? - Yes ADP Receptor Inhibitor (Plavix/Clopidogrel, Brilinta/Ticagrelor or Effient/Prasugrel) prescribed (includes medically managed patients)? - Yes High Intensity Statin (Lipitor 40-80mg  or Crestor 20-40mg ) prescribed? - Yes For EF <40%, was  ACEI/ARB prescribed? - Yes For EF <40%, Aldosterone Antagonist (Spironolactone or Eplerenone) prescribed? - No - Reason:  Will titrate outpatient  Cardiac Rehab Phase II ordered (Included  Medically managed Patients)? - Yes  _____________   Discharge Vitals Blood pressure (!) 168/78, pulse 78, temperature 98.1 F (36.7 C), temperature source Oral, resp. rate 18, height 5\' 7"  (1.702 m), weight 79.8 kg, SpO2 98 %.  Filed Weights   08/12/22 0653  Weight: 79.8 kg    Last Labs & Radiologic Studies    CBC No results for input(s): "WBC", "NEUTROABS", "HGB", "HCT", "MCV", "PLT" in the last 72 hours. Basic Metabolic Panel No results for input(s): "NA", "K", "CL", "CO2", "GLUCOSE", "BUN", "CREATININE", "CALCIUM", "MG", "PHOS" in the last 72 hours. Liver Function Tests No results for input(s): "AST", "ALT", "ALKPHOS", "BILITOT", "PROT", "ALBUMIN" in the last 72 hours. No results for input(s): "LIPASE", "AMYLASE" in the last 72 hours. High Sensitivity Troponin:   No results for input(s): "TROPONINIHS" in the last 720 hours.  BNP Invalid input(s): "POCBNP" D-Dimer No results for input(s): "DDIMER" in the last 72 hours. Hemoglobin A1C No results for input(s): "HGBA1C" in the last 72 hours. Fasting Lipid Panel No results for input(s): "CHOL", "HDL", "LDLCALC", "TRIG", "CHOLHDL", "LDLDIRECT" in the last 72 hours. Thyroid Function Tests No results for input(s): "TSH", "T4TOTAL", "T3FREE", "THYROIDAB" in the last 72 hours.  Invalid input(s): "FREET3" _____________  CARDIAC CATHETERIZATION  Result Date: 08/12/2022   Mid LAD lesion is 90% stenosed.   A drug-eluting stent was successfully placed using a STENT SYNERGY XD 3.0X32 postdilated to greater than 3.5 mm.   Post intervention, there is a 0% residual stenosis.   There is moderate left ventricular systolic dysfunction.   LV end diastolic pressure is normal.   The left ventricular ejection fraction is 35-45% by visual estimate.   There is no aortic valve stenosis. Continue dual antiplatelet therapy along with aggressive secondary prevention.  Continue medical therapy for LV dysfunction as well. Plan for same day discharge.     Disposition   Pt is being discharged home today in good condition.  Follow-up Plans & Appointments     Follow-up Information     Swinyer, Zachary George, NP Follow up.   Specialty: Cardiology Why: Your follow up appointment is Tuesday Aug 25, 2022 10:55 AM (Arrive by 10:40 AM). Contact information: [address] [place] [address] [phone]                Discharge Instructions     Amb Referral to Cardiac Rehabilitation   Complete by: As directed    Diagnosis: PTCA   After initial evaluation and assessments completed: Virtual Based Care may be provided alone or in conjunction with Phase 2 Cardiac Rehab based on patient barriers.: Yes   Intensive Cardiac Rehabilitation (ICR) [facility] location only OR Traditional Cardiac Rehabilitation (TCR) *If criteria for ICR are not met will enroll in TCR [facility] only): Yes   Diet - low sodium heart healthy   Complete by: As directed    Discharge instructions   Complete by: As directed    !!! IMPORTANT: After a heart catheterization, you will need to avoid taking metformin for at least 48 hours. We would recommend you restart this on 08/15/2022.   Increase activity slowly   Complete by: As directed    See end of after visit summary for activity and woundcare directions.        Discharge Medications   Allergies as of 08/12/2022  Reactions   Other Hives   WOOL in clothing   Banana Diarrhea   Metformin Hcl Diarrhea   Shrimp Extract Hives        Medication List     TAKE these medications    acetaminophen 500 MG tablet Commonly known as: TYLENOL Take 1,000 mg by mouth every 6 (six) hours as needed. For pain.   aspirin EC 81 MG tablet Take 81 mg by mouth daily.   carvedilol 3.125 MG tablet Commonly known as: COREG Take 1 tablet (3.125 mg total) by mouth 2 (two) times daily with a meal.   clopidogrel 75 MG tablet Commonly known as: PLAVIX Take 75 mg by mouth daily.   ergocalciferol 1.25 MG (50000  UT) capsule Commonly known as: VITAMIN D2 Take 1 capsule (50,000 Units total) by mouth once a week.   HYDROPHILIC EX Apply 1 Application topically every Friday.   insulin glargine-yfgn 100 UNIT/ML injection Commonly known as: SEMGLEE Inject 34 Units into the skin every evening.   losartan 100 MG tablet Commonly known as: COZAAR Take 100 mg by mouth daily.   metFORMIN 500 MG tablet Commonly known as: GLUCOPHAGE Take 500 mg by mouth daily with breakfast. Notes to patient: !!! IMPORTANT: After a heart catheterization, you will need to avoid taking metformin for at least 48 hours. We would recommend you restart this on 08/15/2022.   nitroGLYCERIN 0.4 MG SL tablet Commonly known as: NITROSTAT Place 1 tablet (0.4 mg total) under the tongue every 5 (five) minutes as needed for chest pain (up to 3 doses).   rosuvastatin 20 MG tablet Commonly known as: CRESTOR Take 20 mg by mouth daily.           Allergies Allergies  Allergen Reactions   Other Hives    WOOL in clothing   Banana Diarrhea   Metformin Hcl Diarrhea   Shrimp Extract Hives    Outstanding Labs/Studies    Duration of Discharge Encounter   Greater than 30 minutes including physician time.  Signed, Abagail Kitchens, PA-C 08/12/2022, 4:44 PM    I have examined the patient and reviewed assessment and plan and discussed with patient.  Agree with above as stated.    Successful LAD PCI.  PLan for DAPT.  Medical therapy for LV dysfunction. Adding beta blocker for better management of HTN and LV dysfunction.   Lance Muss

## 2022-08-12 NOTE — Progress Notes (Signed)
CARDIAC REHAB PHASE I    Post stent education including site care, restrictions, risk factors, exercise guidelines, antiplatelet therapy importance, heart healthy diabetic diet and CRP2 reviewed. All questions and concerns addressed. Will refer to [facility] for CRP2. Plan for home today.   [id number]  Woodroe Chen, RN BSN 08/12/2022 12:24 PM

## 2022-08-12 NOTE — Interval H&P Note (Signed)
Cath Lab Visit (complete for each Cath Lab visit)  Clinical Evaluation Leading to the Procedure:   ACS: No.  Non-ACS:    Anginal Classification: CCS II  Anti-ischemic medical therapy: Maximal Therapy (2 or more classes of medications)  Non-Invasive Test Results: High-risk stress test findings: cardiac mortality >3%/year  Prior CABG: No previous CABG  High risk testing due to low EF    History and Physical Interval Note:  08/12/2022 8:13 AM  Tristan Figueroa  has presented today for surgery, with the diagnosis of CAD.  The various methods of treatment have been discussed with the patient and family. After consideration of risks, benefits and other options for treatment, the patient has consented to  Procedure(s): LEFT HEART CATH AND CORONARY ANGIOGRAPHY (N/A) as a surgical intervention.  The patient's history has been reviewed, patient examined, no change in status, stable for surgery.  I have reviewed the patient's chart and labs.  Questions were answered to the patient's satisfaction.     Lance Muss

## 2022-08-12 NOTE — Discharge Instructions (Addendum)

## 2022-08-13 ENCOUNTER — Encounter (HOSPITAL_COMMUNITY): Payer: Self-pay | Admitting: Interventional Cardiology

## 2022-08-13 LAB — POCT ACTIVATED CLOTTING TIME: Activated Clotting Time: 385 seconds

## 2022-08-13 NOTE — Telephone Encounter (Signed)
Patient contacted regarding discharge from [facility] on 08/12/2022.  Patient understands to follow up with provider Eligha Bridegroom, NP on 08/25/22 at 10:55 am at [facility] office. Patient understands discharge instructions? yes Patient understands medications and regiment? yes Patient understands to bring all medications to this visit? yes  Ask patient:  Are you enrolled in My Chart yes. Pt will get mychart information for enrollment at his OV   Postop Surgical Patients:                What is your wound status? Any signs/ symptoms of infection  No              Please do not place any creams/ lotions/ or antibiotic ointment on any surgical incisions/ wounds without physician approval.              Do you have any questions about your medications?  All medications (except pain medications) are to be filled by your Cardiologist AFTER your first post op       appointment with them.    No               Do you have help at home with ADL's?  N/a  If you have home health, have you been contacted or seen by the agency? N/a                   Please note that it is ok to remove your surgical dressing, shower (soap/ water), and pat the incision dry.

## 2022-08-19 ENCOUNTER — Telehealth: Payer: Self-pay | Admitting: Interventional Cardiology

## 2022-08-19 NOTE — Telephone Encounter (Signed)
Pt c/o medication issue:  1. Name of Medication:   clopidogrel (PLAVIX) 75 MG tablet    2. How are you currently taking this medication (dosage and times per day)?   Take 75 mg by mouth daily.    3. Are you having a reaction (difficulty breathing--STAT)? No  4. What is your medication issue? Pt would like a callback to let him know how long he should be taking this medication and should he continue to take it everyday. Please advise

## 2022-08-19 NOTE — Telephone Encounter (Signed)
Returned call to patient. He would like to know if he should continue taking Plavix 75mg  daily and for how long.  Per discharge summary from Dr. Eldridge Dace following heart cath on 08/12/22: The patient underwent cardiac cath as noted above with DES to the mid LAD. Plan for DAPT with ASA/Plavix 75mg  daily for at least 6 months.    Patient verbalized understanding and expressed appreciation for call, no further needs voiced at this time.

## 2022-08-22 NOTE — Progress Notes (Unsigned)
Cardiology Office Note:    Date:  08/25/2022   ID:  Tristan Figueroa, DOB [birth year], MRN [id number]  PCP:  [facility]   [facility] Providers Cardiologist:  Lance Muss, MD     Referring MD: [facility]   Chief Complaint: CAD and HFrEF  History of Present Illness:    Tristan Figueroa is a very pleasant 67 y.o. male with a hx of CAD, diabetes, HFrEF, HTN, aortic atherosclerosis, tobacco abuse, and HLD.  Referred to cardiology by [facility] clinic for evaluation of shortness of breath and seen by Dr. Eldridge Dace on 02/18/2022.  Had been seen in ER on November 18 for shortness of breath.  Records show "presents with 1 week of worsening shortness of breath.  He says he most notices it when he wakes up in the morning and sometimes with exertion."  [facility].  He denied chest pain, cough, cold symptoms, abdominal pain, n/v, numbness, weakness, change in urination, leg swelling, leg pain.  BNP was 472, troponin was negative.  No evidence of pulmonary embolism on chest CT.  He had diffuse groundglass opacities with interspersed interlobular septal thickening and a pattern most consistent with pulmonary edema although atypical pneumonia could have a similar appearance, small bilateral pleural effusions right greater than left, diffuse predominantly subpleural and para fistular nodules measuring up to 5 mm in the middle lobe, no follow-up needed if patient is at low risk.  He had good response to IV Lasix in the ER. At office visit,   At office visit 02/18/22, he had been taking Lasix 20 mg twice daily.  He reported walking 3 miles daily.  No prior cardiac issues or tests.  He was on low-dose losartan and low-dose statin.  Lab results from office visit revealed proBNP 408, A1C 10.0, stable renal function.  Echocardiogram was ordered for concern for right heart dysfunction, however there is no record that it has been  completed.  Seen by me in clinic on 07/30/22, accompanied by his wife. He was referred by [facility] for concern for CAD on diagnostic cardiac catheterization at the [place] on 07/21/22. He reported no chest pain, shortness of breath, activity intolerance, palpitations, orthopnea, PND, edema, or syncope. 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  He does not exercise on a consistent basis. Unfortunately, he continues to smoke. Has started trying to eat a healthier diet, less red meat and more fruits and vegetables.    Test results from [place] were reviewed by Dr. Eldridge Dace and he was scheduled for cardiac catheterization on 08/12/2022 which revealed 90% stenosis mid LAD successfully treated with DES. Also concern for LV dysfunction, also seen on echo from 06/16/22.  Today, he is here for post cath follow-up and management of HFrEF. Reports he is feeling well since cardiac cath. Had limited symptoms prior to cardiac cath which revealed CAD and LV dysfunction.  Reports he is ready to go back to work. Home SBP consistently > 140s. Is concerned about financial constraints if he continues to receive care here. States we provide excellent care, but he has to go through [place] for financial reasons. He denies chest pain, shortness of breath, lower extremity edema, fatigue, palpitations, melena, hematuria, hemoptysis, diaphoresis, weakness, presyncope, syncope, orthopnea, and PND. No specific concerns about medications, just feels that he is taking too many. No specific cardiac symptoms to report.   Past Medical History:  Diagnosis Date   Diabetes mellitus    type 2   Hyperlipidemia    Hypertension    SOB (shortness of breath)     Past Surgical History:  Procedure Laterality Date   CORONARY STENT INTERVENTION N/A 08/12/2022   Procedure: CORONARY STENT INTERVENTION;  Surgeon: Corky Crafts, MD;  Location: [facility] INVASIVE CV LAB;  Service: Cardiovascular;   Laterality: N/A;   LEFT HEART CATH AND CORONARY ANGIOGRAPHY N/A 08/12/2022   Procedure: LEFT HEART CATH AND CORONARY ANGIOGRAPHY;  Surgeon: Corky Crafts, MD;  Location: [facility] INVASIVE CV LAB;  Service: Cardiovascular;  Laterality: N/A;   ORIF CALCANEOUS FRACTURE  11/25/2011   Procedure: OPEN REDUCTION INTERNAL FIXATION (ORIF) CALCANEOUS FRACTURE;  Surgeon: Nadara Mustard, MD;  Location: [facility] OR;  Service: Orthopedics;  Laterality: Left;  Open Reduction Internal Fixation Left Calcaneous    Current Medications: Current Meds  Medication Sig   acetaminophen (TYLENOL) 500 MG tablet Take 1,000 mg by mouth every 6 (six) hours as needed. For pain.   aspirin EC 81 MG tablet Take 81 mg by mouth daily.   carvedilol (COREG) 3.125 MG tablet Take 1 tablet (3.125 mg total) by mouth 2 (two) times daily with a meal.   clopidogrel (PLAVIX) 75 MG tablet Take 75 mg by mouth daily.   ergocalciferol (VITAMIN D2) 50000 UNITS capsule Take 1 capsule (50,000 Units total) by mouth once a week.   HYDROPHILIC EX Apply 1 Application topically every Friday.   insulin glargine-yfgn (SEMGLEE) 100 UNIT/ML injection Inject 34 Units into the skin every evening.   metFORMIN (GLUCOPHAGE) 500 MG tablet Take 500 mg by mouth daily with breakfast.   nitroGLYCERIN (NITROSTAT) 0.4 MG SL tablet Place 1 tablet (0.4 mg total) under the tongue every 5 (five) minutes as needed for chest pain (up to 3 doses).   rosuvastatin (CRESTOR) 20 MG tablet Take 20 mg by mouth daily.   sacubitril-valsartan (ENTRESTO) 49-51 MG Take 1 tablet by mouth 2 (two) times daily.   [DISCONTINUED] losartan (COZAAR) 100 MG tablet Take 100 mg by mouth daily.     Allergies:   Other, Banana, Metformin hcl, and Shrimp extract   Social History   Socioeconomic History   Marital status: Married    Spouse name: Not on file   Number of children: 2   Years of education: Not on file   Highest education level: Not on file  Occupational History   Occupation:  INSTALLER    Employer: [employer]  Tobacco Use   Smoking status: Every Day    Packs/day: 0.50    Years: 1.00    Additional pack years: 0.00    Total pack years: 0.50    Types: Cigarettes   Smokeless tobacco: Never  Substance and Sexual Activity   Alcohol use: No   Drug use: No   Sexual activity: Not on file  Other Topics Concern   Not on file  Social History Narrative   Active at work, no routine exercise   diet: tries to eat well-healthy   Social Determinants of Health   Financial Resource Strain: Not on file  Food Insecurity: Not on file  Transportation Needs: Not on file  Physical Activity: Not on file  Stress: Not on file  Social Connections: Not on file     Family History: The patient's family history includes Dementia in his mother; Diabetes in his mother; Hypertension in his mother. There is no history of Coronary artery disease, Colon cancer, or Prostate cancer.  ROS:   Please see the history of present illness.   All other systems reviewed and are negative.  Labs/Other Studies Reviewed:    The following studies were reviewed today:  LHC 08/12/22   Mid LAD lesion is 90% stenosed.   A drug-eluting stent was successfully placed using a STENT SYNERGY XD 3.0X32 postdilated to greater than 3.5 mm.   Post intervention, there is a 0% residual stenosis.   There is moderate left ventricular systolic dysfunction.   LV end diastolic pressure is normal.   The left ventricular ejection fraction is 35-45% by visual estimate.   There is no aortic valve stenosis.   Continue dual antiplatelet therapy along with aggressive secondary prevention.  Continue medical therapy for LV dysfunction as well.   Plan for same day discharge.   Echo from [facility] 06/16/22 LVEF 38%, G2 DD, moderate global HK Mild MR  Recent Labs: 02/14/2022: B Natriuretic Peptide 472.5 02/18/2022: NT-Pro BNP 408 07/30/2022: BUN 18; Creatinine, Ser 0.90; Hemoglobin 13.0; Platelets 218; Potassium 4.3;  Sodium 144  Recent Lipid Panel    Component Value Date/Time   CHOL 205 (H) 07/01/2012 0842   TRIG 57.0 07/01/2012 0842   TRIG 72 04/06/2006 0926   HDL 49.80 07/01/2012 0842   CHOLHDL 4 07/01/2012 0842   VLDL 11.4 07/01/2012 0842   LDLCALC 131 (H) 01/07/2011 1110   LDLDIRECT 143.4 07/01/2012 0842     Risk Assessment/Calculations:           Physical Exam:    VS:  BP (!) 156/84   Pulse 80   Ht 5' 7.5" (1.715 m)   Wt 177 lb 3.2 oz (80.4 kg)   SpO2 96%   BMI 27.34 kg/m     Wt Readings from Last 3 Encounters:  08/25/22 177 lb 3.2 oz (80.4 kg)  08/12/22 176 lb (79.8 kg)  07/30/22 176 lb 3.2 oz (79.9 kg)     GEN:  Well nourished, well developed in no acute distress HEENT: Normal NECK: No JVD; No carotid bruits CARDIAC: RRR, no murmurs, rubs, gallops RESPIRATORY:  Clear to auscultation without rales, wheezing or rhonchi  ABDOMEN: Soft, non-tender, non-distended MUSCULOSKELETAL:  No edema; No deformity. 2+ pedal/radial pulses, equal bilaterally.  Right radial cath site has healed with no abnormalities noted SKIN: Warm and dry NEUROLOGIC:  Alert and oriented x 3 PSYCHIATRIC:  Normal affect   EKG:  EKG is ordered today.  EKG today reveals normal sinus rhythm at 80 bpm, RBBB, LAFB   Diagnoses:    1. Coronary artery disease involving native coronary artery of native heart without angina pectoris   2. Status post coronary artery stent placement   3. Aortic atherosclerosis (HCC)   4. Chronic HFrEF (heart failure with reduced ejection fraction) (HCC)   5. Tobacco abuse   6. Essential hypertension   7. Hyperlipidemia, unspecified hyperlipidemia type   8. Medication management     Assessment and Plan:     CAD without angina: Diagnostic catheterization at the [facility] 07/21/22 that revealed 3 vessel CAD with mid LAD 80% stenosis, Prox Cx 40% stenosis, 2nd OM 30% and RCA 30% stenoses. LHC 08/12/22 that revealed mid LAD lesion 90% stenosis successfully treated with DES, moderate LV  dysfunction 35-45%. He denies chest pain, dyspnea, or other symptoms concerning for angina.  No indication for further ischemic evaluation at this time.  No bleeding concerns. Continue aspirin, Plavix, carvedilol, rosuvastatin. Will check LDL in 2 weeks when he returns for kidney function labs for change from  ARB to ARNI. Goal LDL < 55.  HFrEF: NYHA Class I. Echo from [facility] 06/16/22 with LVEF 38%, G2DD, LV function estimated at 35-45% on cardiac cath 08/12/22. He denies shortness of breath, dyspnea, orthopnea, PND, edema. Does not consistently monitor weight but feels it has been stable. BP elevated in clinic today and at home per his report. Renal function and electrolytes stable on labs completed 07/30/22. Lengthy discussion about management of cardiac care going forward. I would like to change his losartan to Entresto 49-51 twice daily, he is agreeable. Will get repeat BMP in 2 weeks. Advised him to notify us if cost prohibitive.   Tobacco abuse: Does not give a specific answer about quitting. Complete cessation advised.   Medication management: He feels that he is taking a lot of medications.  We reviewed each of his cardiac medicines and the purpose. Emphasized the importance of notifying us if he has intolerance or other concerns with any of his cardiac medications.    Hypertension: BP is elevated in clinic today. He reports elevated BP at home as well.  We will DC losartan and start Entresto 49-51 twice daily for HFrEF and potential improvement in BP. He will contact us if he is going to return for follow-up. Encouraged BP consistently < 140 and < 80.  Continue carvedilol.  Hyperlipidemia: No recent LDL to review.  Emphasized the importance of LDL 55 or lower in the setting of CAD.  We will check in 2 weeks when he returns for lab work for change in ARB to ARNI.    Disposition: Contact us if you need a follow-up appointment  Medication Adjustments/Labs and Tests Ordered: Current medicines are  reviewed at length with the patient today.  Concerns regarding medicines are outlined above.  Orders Placed This Encounter  Procedures   Comp Met (CMET)   Lipid Profile   EKG 12-Lead   Meds ordered this encounter  Medications   sacubitril-valsartan (ENTRESTO) 49-51 MG    Sig: Take 1 tablet by mouth 2 (two) times daily.    Dispense:  180 tablet    Refill:  3    Please Honor Card patient is presenting for Cecelia Byars: [id number]; Alvira Philips: [id number]; RXPCN: [id number]; RXID: [id number]    Patient Instructions  Medication Instructions:   DISCONTINUE Losartan  START Entresto one (1) tablet by mouth ( 49-51 mg) twice daily.   *If you need a refill on your cardiac medications before your next appointment, please call your pharmacy*   Lab Work:  Your physician recommends that you return for a FASTING lipid profile/cmet. On Friday, June 14. You can come in on the day of your appointment anytime between 7:30-4:30 fasting from midnight the night before.     If you have labs (blood work) drawn today and your tests are completely normal, you will receive your results only by: MyChart Message (if you have MyChart) OR A paper copy in the mail If you have any lab test that is abnormal or we need to change your treatment, we will call you to review the results.   Testing/Procedures:  None ordered.   Follow-Up: At [facility], you and your health needs are our priority.  As part of our continuing mission to provide you with exceptional heart care, we have created designated Provider Care Teams.  These Care Teams include your primary Cardiologist (physician) and Advanced Practice Providers (APPs -  Physician Assistants and Nurse Practitioners) who all work together to provide you with the care you  need, when you need it.  We recommend signing up for the patient portal called "MyChart".  Sign up information is provided on this After Visit Summary.  MyChart is used to connect with patients  for Virtual Visits (Telemedicine).  Patients are able to view lab/test results, encounter notes, upcoming appointments, etc.  Non-urgent messages can be sent to your provider as well.   To learn more about what you can do with MyChart, go to [email or web address].    Your next appointment:   1 year(s)  Provider:   Eligha Bridegroom, NP         Other Instructions  Your physician wants you to follow-up in: 1 year with Eligha Bridegroom, NP You will receive a reminder letter in the mail two months in advance. If you don't receive a letter, please call our office to schedule the follow-up appointment.     Signed, Levi Aland, NP  08/25/2022 1:03 PM    [facility]

## 2022-08-25 ENCOUNTER — Ambulatory Visit: Payer: Medicare PPO | Attending: Nurse Practitioner | Admitting: Nurse Practitioner

## 2022-08-25 ENCOUNTER — Encounter: Payer: Self-pay | Admitting: Nurse Practitioner

## 2022-08-25 VITALS — BP 156/84 | HR 80 | Ht 67.5 in | Wt 177.2 lb

## 2022-08-25 DIAGNOSIS — Z720 Tobacco use: Secondary | ICD-10-CM

## 2022-08-25 DIAGNOSIS — I700 Atherosclerosis of aorta: Secondary | ICD-10-CM

## 2022-08-25 DIAGNOSIS — E785 Hyperlipidemia, unspecified: Secondary | ICD-10-CM

## 2022-08-25 DIAGNOSIS — Z79899 Other long term (current) drug therapy: Secondary | ICD-10-CM

## 2022-08-25 DIAGNOSIS — I5022 Chronic systolic (congestive) heart failure: Secondary | ICD-10-CM

## 2022-08-25 DIAGNOSIS — I2510 Atherosclerotic heart disease of native coronary artery without angina pectoris: Secondary | ICD-10-CM

## 2022-08-25 DIAGNOSIS — I10 Essential (primary) hypertension: Secondary | ICD-10-CM

## 2022-08-25 DIAGNOSIS — Z955 Presence of coronary angioplasty implant and graft: Secondary | ICD-10-CM

## 2022-08-25 MED ORDER — ENTRESTO 49-51 MG PO TABS: 1.0000 | ORAL_TABLET | Freq: Two times a day (BID) | ORAL | 3 refills | Status: AC

## 2022-08-25 NOTE — Patient Instructions (Signed)
Medication Instructions:   DISCONTINUE Losartan  START Entresto one (1) tablet by mouth ( 49-51 mg) twice daily.   *If you need a refill on your cardiac medications before your next appointment, please call your pharmacy*   Lab Work:  Your physician recommends that you return for a FASTING lipid profile/cmet. On Friday, June 14. You can come in on the day of your appointment anytime between 7:30-4:30 fasting from midnight the night before.     If you have labs (blood work) drawn today and your tests are completely normal, you will receive your results only by: MyChart Message (if you have MyChart) OR A paper copy in the mail If you have any lab test that is abnormal or we need to change your treatment, we will call you to review the results.   Testing/Procedures:  None ordered.   Follow-Up: At [facility], you and your health needs are our priority.  As part of our continuing mission to provide you with exceptional heart care, we have created designated Provider Care Teams.  These Care Teams include your primary Cardiologist (physician) and Advanced Practice Providers (APPs -  Physician Assistants and Nurse Practitioners) who all work together to provide you with the care you need, when you need it.  We recommend signing up for the patient portal called "MyChart".  Sign up information is provided on this After Visit Summary.  MyChart is used to connect with patients for Virtual Visits (Telemedicine).  Patients are able to view lab/test results, encounter notes, upcoming appointments, etc.  Non-urgent messages can be sent to your provider as well.   To learn more about what you can do with MyChart, go to [email or web address].    Your next appointment:   1 year(s)  Provider:   Eligha Bridegroom, NP         Other Instructions  Your physician wants you to follow-up in: 1 year with Eligha Bridegroom, NP You will receive a reminder letter in the mail two months in  advance. If you don't receive a letter, please call our office to schedule the follow-up appointment.

## 2022-08-28 ENCOUNTER — Inpatient Hospital Stay
Admission: RE | Admit: 2022-08-28 | Discharge: 2022-08-28 | Disposition: A | Discharge status: Home / Self Care | Payer: Self-pay | Source: Ambulatory Visit | Attending: Interventional Cardiology | Admitting: Interventional Cardiology

## 2022-08-28 ENCOUNTER — Other Ambulatory Visit: Payer: Self-pay | Admitting: Cardiology

## 2022-08-28 DIAGNOSIS — I25118 Atherosclerotic heart disease of native coronary artery with other forms of angina pectoris: Secondary | ICD-10-CM

## 2022-09-01 ENCOUNTER — Telehealth (HOSPITAL_COMMUNITY): Payer: Self-pay

## 2022-09-01 NOTE — Telephone Encounter (Signed)
Called and spoke with pt in regards to CR, pt stated he will contact the [place] again to send over auth. Provided pt with [facility] fax number.   Placed pt ppw in [place] folder.

## 2022-09-07 ENCOUNTER — Telehealth (HOSPITAL_COMMUNITY): Payer: Self-pay

## 2022-09-07 NOTE — Telephone Encounter (Signed)
Called patient to see if he was interested in participating in the Cardiac Rehab Program. Patient stated yes. Patient will come in for orientation on 09/14/22 @ 8AM and will attend the 8:15AM exercise class.   Pensions consultant.

## 2022-09-11 ENCOUNTER — Telehealth (HOSPITAL_COMMUNITY): Payer: Self-pay

## 2022-09-11 ENCOUNTER — Ambulatory Visit: Payer: No Typology Code available for payment source

## 2022-09-11 NOTE — Telephone Encounter (Signed)
Called and spoke with pt in regards to rescheduling for CR. Patient will come in for orientation on 09/21/22 @ 8AM and will attend the 8:15AM exercise class.   Pensions consultant.

## 2022-09-11 NOTE — Telephone Encounter (Signed)
Called pt to confirm appt for CRP2 on 6/17 at 800. Pt would like to reschedule orientation due to conflicts with orientation time. All CRP2 appts removed.   Jonna Coup, MS, ACSM-CEP 09/11/2022 2:31 PM

## 2022-09-14 ENCOUNTER — Ambulatory Visit: Payer: Medicare PPO | Admitting: Nurse Practitioner

## 2022-09-14 ENCOUNTER — Ambulatory Visit (HOSPITAL_COMMUNITY): Payer: No Typology Code available for payment source

## 2022-09-17 ENCOUNTER — Telehealth (HOSPITAL_COMMUNITY): Payer: Self-pay

## 2022-09-17 NOTE — Telephone Encounter (Signed)
Called pt to confirm appt for 6/24 at 800. No answer, LM on V>.  Jonna Coup, MS, ACSM-CEP 09/17/2022 9:08 AM

## 2022-09-21 ENCOUNTER — Encounter (HOSPITAL_COMMUNITY)
Admission: RE | Admit: 2022-09-21 | Discharge: 2022-09-21 | Disposition: A | Discharge status: Home / Self Care | Payer: No Typology Code available for payment source | Source: Ambulatory Visit | Attending: Interventional Cardiology | Admitting: Interventional Cardiology

## 2022-09-21 ENCOUNTER — Ambulatory Visit (HOSPITAL_COMMUNITY): Payer: No Typology Code available for payment source

## 2022-09-21 ENCOUNTER — Encounter (HOSPITAL_COMMUNITY): Payer: Self-pay

## 2022-09-21 VITALS — BP 162/80 | HR 77 | Ht 67.5 in | Wt 180.8 lb

## 2022-09-21 DIAGNOSIS — Z9861 Coronary angioplasty status: Secondary | ICD-10-CM | POA: Diagnosis present

## 2022-09-21 LAB — GLUCOSE, CAPILLARY: Glucose-Capillary: 260 mg/dL — ABNORMAL HIGH (ref 70–99)

## 2022-09-21 NOTE — Progress Notes (Addendum)
Cardiac Rehab Medication Review   Does the patient  feel that his/her medications are working for him/her?  YES  Has the patient been experiencing any side effects to the medications prescribed?  NO  Does the patient measure his/her own blood pressure or blood glucose at home?  NO   Does the patient have any problems obtaining medications due to transportation or finances?   NO  Understanding of regimen: excellent Understanding of indications: excellent Potential of compliance: good    Comments: Tristan Figueroa has a great understanding of his medications and regime. He does not check his BP or CBG's at home, encouraged to do so. Tristan Figueroa also shared that he only has 2 more doses of his Crestor and cannot get his refill until July 11th. Tristan Figueroa has reached out to the [place] regarding this already and is planning to again.  Tristan Figueroa also has not started his Sherryll Burger yet due to concerns about the potential side effects.    Jonna Coup, MS, ACSM-CEP 09/21/2022 8:42 AM

## 2022-09-21 NOTE — Progress Notes (Addendum)
Cardiac Individual Treatment Plan  Patient Details  Name: Tristan Figueroa MRN: [id number] Date of Birth: [birth year] Referring Provider:   Flowsheet Row INTENSIVE CARDIAC REHAB ORIENT from 09/21/2022 in [facility]  Referring Provider Lance Muss,  MD       Initial Encounter Date:  Flowsheet Row INTENSIVE CARDIAC REHAB ORIENT from 09/21/2022 in [facility], [facility]  Date 09/21/22       Visit Diagnosis: 5/15 DES LAD  Patient's Home Medications on Admission:  Current Outpatient Medications:    acetaminophen (TYLENOL) 500 MG tablet, Take 1,000 mg by mouth every 6 (six) hours as needed. For pain., Disp: , Rfl:    aspirin EC 81 MG tablet, Take 81 mg by mouth daily., Disp: , Rfl:    carvedilol (COREG) 3.125 MG tablet, Take 1 tablet (3.125 mg total) by mouth 2 (two) times daily with a meal., Disp: 60 tablet, Rfl: 5   clopidogrel (PLAVIX) 75 MG tablet, Take 75 mg by mouth daily., Disp: , Rfl:    ergocalciferol (VITAMIN D2) 50000 UNITS capsule, Take 1 capsule (50,000 Units total) by mouth once a week., Disp: 12 capsule, Rfl: 0   HYDROPHILIC EX, Apply 1 Application topically every Friday., Disp: , Rfl:    insulin glargine-yfgn (SEMGLEE) 100 UNIT/ML injection, Inject 34 Units into the skin every evening., Disp: , Rfl:    metFORMIN (GLUCOPHAGE) 500 MG tablet, Take 500 mg by mouth daily with breakfast., Disp: , Rfl:    nitroGLYCERIN (NITROSTAT) 0.4 MG SL tablet, Place 1 tablet (0.4 mg total) under the tongue every 5 (five) minutes as needed for chest pain (up to 3 doses)., Disp: 25 tablet, Rfl: 3   rosuvastatin (CRESTOR) 20 MG tablet, Take 20 mg by mouth daily., Disp: , Rfl:    sacubitril-valsartan (ENTRESTO) 49-51 MG, Take 1 tablet by mouth 2 (two) times daily. (Patient not taking: Reported on 09/21/2022), Disp: 180 tablet, Rfl: 3  Past Medical History: Past Medical History:   Diagnosis Date   Diabetes mellitus    type 2   Hyperlipidemia    Hypertension    SOB (shortness of breath)     Tobacco Use: Social History   Tobacco Use  Smoking Status Every Day   Packs/day: 0.50   Years: 1.00   Additional pack years: 0.00   Total pack years: 0.50   Types: Cigarettes  Smokeless Tobacco Never    Labs: Review Flowsheet  More data exists      Latest Ref Rng & Units 08/30/2009 02/28/2010 01/07/2011 07/01/2012 02/18/2022  Labs for ITP Cardiac and Pulmonary Rehab  Cholestrol 0 - 200 mg/dL - 865  784  696  -  LDL (calc) 0 - 99 mg/dL - 84  295  - -  Direct LDL mg/dL - - - 284.1  -  HDL-C >32.44 mg/dL - 01.02  72.53  66.44  -  Trlycerides 0.0 - 149.0 mg/dL - 03.4  74.2  59.5  -  Hemoglobin A1c 4.8 - 5.6 % 7.0  7.2  7.2  9.0  10.0     Capillary Blood Glucose: Lab Results  Component Value Date   GLUCAP 260 (H) 09/21/2022   GLUCAP 96 08/12/2022   GLUCAP 121 (H) 08/12/2022   GLUCAP 149 (H) 11/28/2011   GLUCAP 168 (H) 11/27/2011     Exercise Target Goals: Exercise Program Goal: Individual exercise prescription set using results from initial 6 min walk test and  THRR while considering  patient's activity barriers and safety.   Exercise Prescription Goal: Initial exercise prescription builds to 30-45 minutes a day of aerobic activity, 2-3 days per week.  Home exercise guidelines will be given to patient during program as part of exercise prescription that the participant will acknowledge.  Activity Barriers & Risk Stratification:  Activity Barriers & Cardiac Risk Stratification - 09/21/22 1039       Activity Barriers & Cardiac Risk Stratification   Activity Barriers Left Hip Replacement;Right Hip Replacement;Balance Concerns;Decreased Ventricular Function;Joint Problems    Cardiac Risk Stratification High   <5 METs on            6 Minute Walk:  6 Minute Walk     Row Name 09/21/22 1038         6 Minute Walk   Phase Initial     Distance  1365 feet     Walk Time 6 minutes     # of Rest Breaks 0     MPH 2.85     METS 3.42     RPE 11     Perceived Dyspnea  0     VO2 Peak 11.96     Symptoms No     Resting HR 77 bpm     Resting BP 162/80     Resting Oxygen Saturation  98 %     Exercise Oxygen Saturation  during 6 min walk 99 %     Max Ex. HR 102 bpm     Max Ex. BP 162/78     2 Minute Post BP 148/80              Oxygen Initial Assessment:   Oxygen Re-Evaluation:   Oxygen Discharge (Final Oxygen Re-Evaluation):   Initial Exercise Prescription:  Initial Exercise Prescription - 09/21/22 1000       Date of Initial Exercise RX and Referring Provider   Date 09/21/22    Referring Provider Lance Muss,  MD    Expected Discharge Date 12/04/22      Recumbant Bike   Level 2    RPM 60    Watts 30    Minutes 15    METs 3      Arm Ergometer   Level 1    Watts 20    RPM 50    Minutes 15    METs 3      Prescription Details   Frequency (times per week) 3    Duration Progress to 30 minutes of continuous aerobic without signs/symptoms of physical distress      Intensity   THRR 40-80% of Max Heartrate 60-122    Ratings of Perceived Exertion 11-13    Perceived Dyspnea 0-4      Progression   Progression Continue progressive overload as per policy without signs/symptoms or physical distress.      Resistance Training   Training Prescription Yes    Weight 3    Reps 10-15             Perform Capillary Blood Glucose checks as needed.  Exercise Prescription Changes:   Exercise Comments:   Exercise Goals and Review:   Exercise Goals     Row Name 09/21/22 0837             Exercise Goals   Increase Physical Activity Yes       Intervention Provide advice, education, support and counseling about physical activity/exercise needs.;Develop an individualized exercise prescription for aerobic and resistive  training based on initial evaluation findings, risk stratification, comorbidities and  participant's personal goals.       Expected Outcomes Short Term: Attend rehab on a regular basis to increase amount of physical activity.;Long Term: Exercising regularly at least 3-5 days a week.;Long Term: Add in home exercise to make exercise part of routine and to increase amount of physical activity.       Increase Strength and Stamina Yes       Intervention Provide advice, education, support and counseling about physical activity/exercise needs.;Develop an individualized exercise prescription for aerobic and resistive training based on initial evaluation findings, risk stratification, comorbidities and participant's personal goals.       Expected Outcomes Short Term: Increase workloads from initial exercise prescription for resistance, speed, and METs.;Short Term: Perform resistance training exercises routinely during rehab and add in resistance training at home;Long Term: Improve cardiorespiratory fitness, muscular endurance and strength as measured by increased METs and functional capacity ( )       Able to understand and use rate of perceived exertion (RPE) scale Yes       Intervention Provide education and explanation on how to use RPE scale       Expected Outcomes Short Term: Able to use RPE daily in rehab to express subjective intensity level;Long Term:  Able to use RPE to guide intensity level when exercising independently       Knowledge and understanding of Target Heart Rate Range (THRR) Yes       Intervention Provide education and explanation of THRR including how the numbers were predicted and where they are located for reference       Expected Outcomes Short Term: Able to state/look up THRR;Short Term: Able to use daily as guideline for intensity in rehab;Long Term: Able to use THRR to govern intensity when exercising independently       Understanding of Exercise Prescription Yes       Intervention Provide education, explanation, and written materials on patient's individual exercise  prescription       Expected Outcomes Short Term: Able to explain program exercise prescription;Long Term: Able to explain home exercise prescription to exercise independently                Exercise Goals Re-Evaluation :   Discharge Exercise Prescription (Final Exercise Prescription Changes):   Nutrition:  Target Goals: Understanding of nutrition guidelines, daily intake of sodium 1500mg , cholesterol 200mg , calories 30% from fat and 7% or less from saturated fats, daily to have 5 or more servings of fruits and vegetables.  Biometrics:  Pre Biometrics - 09/21/22 0836       Pre Biometrics   Waist Circumference 40 inches    Hip Circumference 39 inches    Waist to Hip Ratio 1.03 %    Triceps Skinfold 16 mm    % Body Fat 27.9 %    Grip Strength 34 kg    Flexibility 10 in    Single Leg Stand 2.81 seconds              Nutrition Therapy Plan and Nutrition Goals:   Nutrition Assessments:  MEDIFICTS Score Key: ?70 Need to make dietary changes  40-70 Heart Healthy Diet ? 40 Therapeutic Level Cholesterol Diet    Picture Your Plate Scores: <16 Unhealthy dietary pattern with much room for improvement. 41-50 Dietary pattern unlikely to meet recommendations for good health and room for improvement. 51-60 More healthful dietary pattern, with some room for improvement.  >60 Healthy dietary pattern,  although there may be some specific behaviors that could be improved.    Nutrition Goals Re-Evaluation:   Nutrition Goals Re-Evaluation:   Nutrition Goals Discharge (Final Nutrition Goals Re-Evaluation):   Psychosocial: Target Goals: Acknowledge presence or absence of significant depression and/or stress, maximize coping skills, provide positive support system. Participant is able to verbalize types and ability to use techniques and skills needed for reducing stress and depression.  Initial Review & Psychosocial Screening:  Initial Psych Review & Screening - 09/21/22  1046       Initial Review   Current issues with None Identified      Family Dynamics   Good Support System? Yes   Haris has his wife and 2 children for support     Barriers   Psychosocial barriers to participate in program There are no identifiable barriers or psychosocial needs.      Screening Interventions   Interventions Encouraged to exercise;Provide feedback about the scores to participant    Expected Outcomes Short Term goal: Identification and review with participant of any Quality of Life or Depression concerns found by scoring the questionnaire.;Long Term goal: The participant improves quality of Life and PHQ9 Scores as seen by post scores and/or verbalization of changes             Quality of Life Scores:  Quality of Life - 09/21/22 1046       Quality of Life   Select Quality of Life      Quality of Life Scores   Health/Function Pre 27.57 %    Socioeconomic Pre 28.5 %    Psych/Spiritual Pre 27.75 %    Family Pre 30 %    GLOBAL Pre 28.15 %            Scores of 19 and below usually indicate a poorer quality of life in these areas.  A difference of  2-3 points is a clinically meaningful difference.  A difference of 2-3 points in the total score of the Quality of Life Index has been associated with significant improvement in overall quality of life, self-image, physical symptoms, and general health in studies assessing change in quality of life.  PHQ-9: Review Flowsheet       09/21/2022  Depression screen PHQ 2/9  Decreased Interest 0  Down, Depressed, Hopeless 0  PHQ - 2 Score 0  Altered sleeping 0  Tired, decreased energy 0  Change in appetite 0  Feeling bad or failure about yourself  0  Trouble concentrating 0  Moving slowly or fidgety/restless 0  Suicidal thoughts 0  PHQ-9 Score 0   Interpretation of Total Score  Total Score Depression Severity:  1-4 = Minimal depression, 5-9 = Mild depression, 10-14 = Moderate depression, 15-19 = Moderately  severe depression, 20-27 = Severe depression   Psychosocial Evaluation and Intervention:   Psychosocial Re-Evaluation:   Psychosocial Discharge (Final Psychosocial Re-Evaluation):   Vocational Rehabilitation: Provide vocational rehab assistance to qualifying candidates.   Vocational Rehab Evaluation & Intervention:  Vocational Rehab - 09/21/22 1049       Initial Vocational Rehab Evaluation & Intervention   Assessment shows need for Vocational Rehabilitation No   Hy is currently working            Education: Education Goals: Education classes will be provided on a weekly basis, covering required topics. Participant will state understanding/return demonstration of topics presented.     Core Videos: Exercise    Move It!  Clinical staff conducted group or individual video  education with verbal and written material and guidebook.  Patient learns the recommended Pritikin exercise program. Exercise with the goal of living a long, healthy life. Some of the health benefits of exercise include controlled diabetes, healthier blood pressure levels, improved cholesterol levels, improved heart and lung capacity, improved sleep, and better body composition. Everyone should speak with their doctor before starting or changing an exercise routine.  Biomechanical Limitations Clinical staff conducted group or individual video education with verbal and written material and guidebook.  Patient learns how biomechanical limitations can impact exercise and how we can mitigate and possibly overcome limitations to have an impactful and balanced exercise routine.  Body Composition Clinical staff conducted group or individual video education with verbal and written material and guidebook.  Patient learns that body composition (ratio of muscle mass to fat mass) is a key component to assessing overall fitness, rather than body weight alone. Increased fat mass, especially visceral belly fat, can put  [place] at increased risk for metabolic syndrome, type 2 diabetes, heart disease, and even death. It is recommended to combine diet and exercise (cardiovascular and resistance training) to improve your body composition. Seek guidance from your physician and exercise physiologist before implementing an exercise routine.  Exercise Action Plan Clinical staff conducted group or individual video education with verbal and written material and guidebook.  Patient learns the recommended strategies to achieve and enjoy long-term exercise adherence, including variety, self-motivation, self-efficacy, and positive decision making. Benefits of exercise include fitness, good health, weight management, more energy, better sleep, less stress, and overall well-being.  Medical   Heart Disease Risk Reduction Clinical staff conducted group or individual video education with verbal and written material and guidebook.  Patient learns our heart is our most vital organ as it circulates oxygen, nutrients, white blood cells, and hormones throughout the entire body, and carries waste away. Data supports a plant-based eating plan like the Pritikin Program for its effectiveness in slowing progression of and reversing heart disease. The video provides a number of recommendations to address heart disease.   Metabolic Syndrome and Belly Fat  Clinical staff conducted group or individual video education with verbal and written material and guidebook.  Patient learns what metabolic syndrome is, how it leads to heart disease, and how one can reverse it and keep it from coming back. You have metabolic syndrome if you have 3 of the following 5 criteria: abdominal obesity, high blood pressure, high triglycerides, low HDL cholesterol, and high blood sugar.  Hypertension and Heart Disease Clinical staff conducted group or individual video education with verbal and written material and guidebook.  Patient learns that high blood pressure, or  hypertension, is very common in the [place]. Hypertension is largely due to excessive salt intake, but other important risk factors include being overweight, physical inactivity, drinking too much alcohol, smoking, and not eating enough potassium from fruits and vegetables. High blood pressure is a leading risk factor for heart attack, stroke, congestive heart failure, dementia, kidney failure, and premature death. Long-term effects of excessive salt intake include stiffening of the arteries and thickening of heart muscle and organ damage. Recommendations include ways to reduce hypertension and the risk of heart disease.  Diseases of Our Time - Focusing on Diabetes Clinical staff conducted group or individual video education with verbal and written material and guidebook.  Patient learns why the best way to stop diseases of our time is prevention, through food and other lifestyle changes. Medicine (such as prescription pills and surgeries) is  often only a Band-Aid on the problem, not a long-term solution. Most common diseases of our time include obesity, type 2 diabetes, hypertension, heart disease, and cancer. The Pritikin Program is recommended and has been proven to help reduce, reverse, and/or prevent the damaging effects of metabolic syndrome.  Nutrition   Overview of the Pritikin Eating Plan  Clinical staff conducted group or individual video education with verbal and written material and guidebook.  Patient learns about the Pritikin Eating Plan for disease risk reduction. The Pritikin Eating Plan emphasizes a wide variety of unrefined, minimally-processed carbohydrates, like fruits, vegetables, whole grains, and legumes. Go, Caution, and Stop food choices are explained. Plant-based and lean animal proteins are emphasized. Rationale provided for low sodium intake for blood pressure control, low added sugars for blood sugar stabilization, and low added fats and oils for coronary artery disease  risk reduction and weight management.  Calorie Density  Clinical staff conducted group or individual video education with verbal and written material and guidebook.  Patient learns about calorie density and how it impacts the Pritikin Eating Plan. Knowing the characteristics of the food you choose will help you decide whether those foods will lead to weight gain or weight loss, and whether you want to consume more or less of them. Weight loss is usually a side effect of the Pritikin Eating Plan because of its focus on low calorie-dense foods.  Label Reading  Clinical staff conducted group or individual video education with verbal and written material and guidebook.  Patient learns about the Pritikin recommended label reading guidelines and corresponding recommendations regarding calorie density, added sugars, sodium content, and whole grains.  Dining Out - Part 1  Clinical staff conducted group or individual video education with verbal and written material and guidebook.  Patient learns that restaurant meals can be sabotaging because they can be so high in calories, fat, sodium, and/or sugar. Patient learns recommended strategies on how to positively address this and avoid unhealthy pitfalls.  Facts on Fats  Clinical staff conducted group or individual video education with verbal and written material and guidebook.  Patient learns that lifestyle modifications can be just as effective, if not more so, as many medications for lowering your risk of heart disease. A Pritikin lifestyle can help to reduce your risk of inflammation and atherosclerosis (cholesterol build-up, or plaque, in the artery walls). Lifestyle interventions such as dietary choices and physical activity address the cause of atherosclerosis. A review of the types of fats and their impact on blood cholesterol levels, along with dietary recommendations to reduce fat intake is also included.  Nutrition Action Plan  Clinical staff  conducted group or individual video education with verbal and written material and guidebook.  Patient learns how to incorporate Pritikin recommendations into their lifestyle. Recommendations include planning and keeping personal health goals in mind as an important part of their success.  Healthy Mind-Set    Healthy Minds, Bodies, Hearts  Clinical staff conducted group or individual video education with verbal and written material and guidebook.  Patient learns how to identify when they are stressed. Video will discuss the impact of that stress, as well as the many benefits of stress management. Patient will also be introduced to stress management techniques. The way we think, act, and feel has an impact on our hearts.  How Our Thoughts Can Heal Our Hearts  Clinical staff conducted group or individual video education with verbal and written material and guidebook.  Patient learns that negative thoughts can cause  depression and anxiety. This can result in negative lifestyle behavior and serious health problems. Cognitive behavioral therapy is an effective method to help control our thoughts in order to change and improve our emotional outlook.  Additional Videos:  Exercise    Improving Performance  Clinical staff conducted group or individual video education with verbal and written material and guidebook.  Patient learns to use a non-linear approach by alternating intensity levels and lengths of time spent exercising to help burn more calories and lose more body fat. Cardiovascular exercise helps improve heart health, metabolism, hormonal balance, blood sugar control, and recovery from fatigue. Resistance training improves strength, endurance, balance, coordination, reaction time, metabolism, and muscle mass. Flexibility exercise improves circulation, posture, and balance. Seek guidance from your physician and exercise physiologist before implementing an exercise routine and learn your capabilities  and proper form for all exercise.  Introduction to Yoga  Clinical staff conducted group or individual video education with verbal and written material and guidebook.  Patient learns about yoga, a discipline of the coming together of mind, breath, and body. The benefits of yoga include improved flexibility, improved range of motion, better posture and core strength, increased lung function, weight loss, and positive self-image. Yoga's heart health benefits include lowered blood pressure, healthier heart rate, decreased cholesterol and triglyceride levels, improved immune function, and reduced stress. Seek guidance from your physician and exercise physiologist before implementing an exercise routine and learn your capabilities and proper form for all exercise.  Medical   Aging: Enhancing Your Quality of Life  Clinical staff conducted group or individual video education with verbal and written material and guidebook.  Patient learns key strategies and recommendations to stay in good physical health and enhance quality of life, such as prevention strategies, having an advocate, securing a Health Care Proxy and Power of Attorney, and keeping a list of medications and system for tracking them. It also discusses how to avoid risk for bone loss.  Biology of Weight Control  Clinical staff conducted group or individual video education with verbal and written material and guidebook.  Patient learns that weight gain occurs because we consume more calories than we burn (eating more, moving less). Even if your body weight is normal, you may have higher ratios of fat compared to muscle mass. Too much body fat puts you at increased risk for cardiovascular disease, heart attack, stroke, type 2 diabetes, and obesity-related cancers. In addition to exercise, following the Pritikin Eating Plan can help reduce your risk.  Decoding Lab Results  Clinical staff conducted group or individual video education with verbal and  written material and guidebook.  Patient learns that lab test reflects one measurement whose values change over time and are influenced by many factors, including medication, stress, sleep, exercise, food, hydration, pre-existing medical conditions, and more. It is recommended to use the knowledge from this video to become more involved with your lab results and evaluate your numbers to speak with your doctor.   Diseases of Our Time - Overview  Clinical staff conducted group or individual video education with verbal and written material and guidebook.  Patient learns that according to the CDC, 50% to 70% of chronic diseases (such as obesity, type 2 diabetes, elevated lipids, hypertension, and heart disease) are avoidable through lifestyle improvements including healthier food choices, listening to satiety cues, and increased physical activity.  Sleep Disorders Clinical staff conducted group or individual video education with verbal and written material and guidebook.  Patient learns how good quality and  duration of sleep are important to overall health and well-being. Patient also learns about sleep disorders and how they impact health along with recommendations to address them, including discussing with a physician.  Nutrition  Dining Out - Part 2 Clinical staff conducted group or individual video education with verbal and written material and guidebook.  Patient learns how to plan ahead and communicate in order to maximize their dining experience in a healthy and nutritious manner. Included are recommended food choices based on the type of restaurant the patient is visiting.   Fueling a Banker conducted group or individual video education with verbal and written material and guidebook.  There is a strong connection between our food choices and our health. Diseases like obesity and type 2 diabetes are very prevalent and are in large-part due to lifestyle choices. The  Pritikin Eating Plan provides plenty of food and hunger-curbing satisfaction. It is easy to follow, affordable, and helps reduce health risks.  Menu Workshop  Clinical staff conducted group or individual video education with verbal and written material and guidebook.  Patient learns that restaurant meals can sabotage health goals because they are often packed with calories, fat, sodium, and sugar. Recommendations include strategies to plan ahead and to communicate with the manager, chef, or server to help order a healthier meal.  Planning Your Eating Strategy  Clinical staff conducted group or individual video education with verbal and written material and guidebook.  Patient learns about the Pritikin Eating Plan and its benefit of reducing the risk of disease. The Pritikin Eating Plan does not focus on calories. Instead, it emphasizes high-quality, nutrient-rich foods. By knowing the characteristics of the foods, we choose, we can determine their calorie density and make informed decisions.  Targeting Your Nutrition Priorities  Clinical staff conducted group or individual video education with verbal and written material and guidebook.  Patient learns that lifestyle habits have a tremendous impact on disease risk and progression. This video provides eating and physical activity recommendations based on your personal health goals, such as reducing LDL cholesterol, losing weight, preventing or controlling type 2 diabetes, and reducing high blood pressure.  Vitamins and Minerals  Clinical staff conducted group or individual video education with verbal and written material and guidebook.  Patient learns different ways to obtain key vitamins and minerals, including through a recommended healthy diet. It is important to discuss all supplements you take with your doctor.   Healthy Mind-Set    Smoking Cessation  Clinical staff conducted group or individual video education with verbal and written  material and guidebook.  Patient learns that cigarette smoking and tobacco addiction pose a serious health risk which affects millions of people. Stopping smoking will significantly reduce the risk of heart disease, lung disease, and many forms of cancer. Recommended strategies for quitting are covered, including working with your doctor to develop a successful plan.  Culinary   Becoming a Set designer conducted group or individual video education with verbal and written material and guidebook.  Patient learns that cooking at home can be healthy, cost-effective, quick, and puts them in control. Keys to cooking healthy recipes will include looking at your recipe, assessing your equipment needs, planning ahead, making it simple, choosing cost-effective seasonal ingredients, and limiting the use of added fats, salts, and sugars.  Cooking - Breakfast and Snacks  Clinical staff conducted group or individual video education with verbal and written material and guidebook.  Patient learns how important breakfast is  to satiety and nutrition through the entire day. Recommendations include key foods to eat during breakfast to help stabilize blood sugar levels and to prevent overeating at meals later in the day. Planning ahead is also a key component.  Cooking - Educational psychologist conducted group or individual video education with verbal and written material and guidebook.  Patient learns eating strategies to improve overall health, including an approach to cook more at home. Recommendations include thinking of animal protein as a side on your plate rather than center stage and focusing instead on lower calorie dense options like vegetables, fruits, whole grains, and plant-based proteins, such as beans. Making sauces in large quantities to freeze for later and leaving the skin on your vegetables are also recommended to maximize your experience.  Cooking - Healthy Salads and  Dressing Clinical staff conducted group or individual video education with verbal and written material and guidebook.  Patient learns that vegetables, fruits, whole grains, and legumes are the foundations of the Pritikin Eating Plan. Recommendations include how to incorporate each of these in flavorful and healthy salads, and how to create homemade salad dressings. Proper handling of ingredients is also covered. Cooking - Soups and [employer] - Soups and Desserts Clinical staff conducted group or individual video education with verbal and written material and guidebook.  Patient learns that Pritikin soups and desserts make for easy, nutritious, and delicious snacks and meal components that are low in sodium, fat, sugar, and calorie density, while high in vitamins, minerals, and filling fiber. Recommendations include simple and healthy ideas for soups and desserts.   Overview     The Pritikin Solution Program Overview Clinical staff conducted group or individual video education with verbal and written material and guidebook.  Patient learns that the results of the Pritikin Program have been documented in more than 100 articles published in peer-reviewed journals, and the benefits include reducing risk factors for (and, in some cases, even reversing) high cholesterol, high blood pressure, type 2 diabetes, obesity, and more! An overview of the three key pillars of the Pritikin Program will be covered: eating well, doing regular exercise, and having a healthy mind-set.  WORKSHOPS  Exercise: Exercise Basics: Building Your Action Plan Clinical staff led group instruction and group discussion with PowerPoint presentation and patient guidebook. To enhance the learning environment the use of posters, models and videos may be added. At the conclusion of this workshop, patients will comprehend the difference between physical activity and exercise, as well as the benefits of incorporating both, into  their routine. Patients will understand the FITT (Frequency, Intensity, Time, and Type) principle and how to use it to build an exercise action plan. In addition, safety concerns and other considerations for exercise and cardiac rehab will be addressed by the presenter. The purpose of this lesson is to promote a comprehensive and effective weekly exercise routine in order to improve patients' overall level of fitness.   Managing Heart Disease: Your Path to a Healthier Heart Clinical staff led group instruction and group discussion with PowerPoint presentation and patient guidebook. To enhance the learning environment the use of posters, models and videos may be added.At the conclusion of this workshop, patients will understand the anatomy and physiology of the heart. Additionally, they will understand how Pritikin's three pillars impact the risk factors, the progression, and the management of heart disease.  The purpose of this lesson is to provide a high-level overview of the heart, heart disease, and how the  Pritikin lifestyle positively impacts risk factors.  Exercise Biomechanics Clinical staff led group instruction and group discussion with PowerPoint presentation and patient guidebook. To enhance the learning environment the use of posters, models and videos may be added. Patients will learn how the structural parts of their bodies function and how these functions impact their daily activities, movement, and exercise. Patients will learn how to promote a neutral spine, learn how to manage pain, and identify ways to improve their physical movement in order to promote healthy living. The purpose of this lesson is to expose patients to common physical limitations that impact physical activity. Participants will learn practical ways to adapt and manage aches and pains, and to minimize their effect on regular exercise. Patients will learn how to maintain good posture while sitting, walking, and  lifting.  Balance Training and Fall Prevention  Clinical staff led group instruction and group discussion with PowerPoint presentation and patient guidebook. To enhance the learning environment the use of posters, models and videos may be added. At the conclusion of this workshop, patients will understand the importance of their sensorimotor skills (vision, proprioception, and the vestibular system) in maintaining their ability to balance as they age. Patients will apply a variety of balancing exercises that are appropriate for their current level of function. Patients will understand the common causes for poor balance, possible solutions to these problems, and ways to modify their physical environment in order to minimize their fall risk. The purpose of this lesson is to teach patients about the importance of maintaining balance as they age and ways to minimize their risk of falling.  WORKSHOPS   Nutrition:  Fueling a Ship broker led group instruction and group discussion with PowerPoint presentation and patient guidebook. To enhance the learning environment the use of posters, models and videos may be added. Patients will review the foundational principles of the Pritikin Eating Plan and understand what constitutes a serving size in each of the food groups. Patients will also learn Pritikin-friendly foods that are better choices when away from home and review make-ahead meal and snack options. Calorie density will be reviewed and applied to three nutrition priorities: weight maintenance, weight loss, and weight gain. The purpose of this lesson is to reinforce (in a group setting) the key concepts around what patients are recommended to eat and how to apply these guidelines when away from home by planning and selecting Pritikin-friendly options. Patients will understand how calorie density may be adjusted for different weight management goals.  Mindful Eating  Clinical staff led  group instruction and group discussion with PowerPoint presentation and patient guidebook. To enhance the learning environment the use of posters, models and videos may be added. Patients will briefly review the concepts of the Pritikin Eating Plan and the importance of low-calorie dense foods. The concept of mindful eating will be introduced as well as the importance of paying attention to internal hunger signals. Triggers for non-hunger eating and techniques for dealing with triggers will be explored. The purpose of this lesson is to provide patients with the opportunity to review the basic principles of the Pritikin Eating Plan, discuss the value of eating mindfully and how to measure internal cues of hunger and fullness using the Hunger Scale. Patients will also discuss reasons for non-hunger eating and learn strategies to use for controlling emotional eating.  Targeting Your Nutrition Priorities Clinical staff led group instruction and group discussion with PowerPoint presentation and patient guidebook. To enhance the learning environment the use  of posters, models and videos may be added. Patients will learn how to determine their genetic susceptibility to disease by reviewing their family history. Patients will gain insight into the importance of diet as part of an overall healthy lifestyle in mitigating the impact of genetics and other environmental insults. The purpose of this lesson is to provide patients with the opportunity to assess their personal nutrition priorities by looking at their family history, their own health history and current risk factors. Patients will also be able to discuss ways of prioritizing and modifying the Pritikin Eating Plan for their highest risk areas  Menu  Clinical staff led group instruction and group discussion with PowerPoint presentation and patient guidebook. To enhance the learning environment the use of posters, models and videos may be added. Using menus  brought in from [employer], or printed from [employer], patients will apply the Pritikin dining out guidelines that were presented in the [employer] video. Patients will also be able to practice these guidelines in a variety of provided scenarios. The purpose of this lesson is to provide patients with the opportunity to practice hands-on learning of the Pritikin Dining Out guidelines with actual menus and practice scenarios.  Label Reading Clinical staff led group instruction and group discussion with PowerPoint presentation and patient guidebook. To enhance the learning environment the use of posters, models and videos may be added. Patients will review and discuss the Pritikin label reading guidelines presented in Pritikin's Label Reading Educational series video. Using fool labels brought in from local grocery stores and markets, patients will apply the label reading guidelines and determine if the packaged food meet the Pritikin guidelines. The purpose of this lesson is to provide patients with the opportunity to review, discuss, and practice hands-on learning of the Pritikin Label Reading guidelines with actual packaged food labels. Cooking School  Pritikin's [employer] are designed to teach patients ways to prepare quick, simple, and affordable recipes at home. The importance of nutrition's role in chronic disease risk reduction is reflected in its emphasis in the overall Pritikin program. By learning how to prepare essential core Pritikin Eating Plan recipes, patients will increase control over what they eat; be able to customize the flavor of foods without the use of added salt, sugar, or fat; and improve the quality of the food they consume. By learning a set of core recipes which are easily assembled, quickly prepared, and affordable, patients are more likely to prepare more healthy foods at home. These workshops focus on convenient breakfasts, simple  entres, side dishes, and desserts which can be prepared with minimal effort and are consistent with nutrition recommendations for cardiovascular risk reduction. Cooking [employer] are taught by a Armed forces logistics/support/administrative officer (RD) who has been trained by the [employer]. The chef or RD has a clear understanding of the importance of minimizing - if not completely eliminating - added fat, sugar, and sodium in recipes. Throughout the series of Cooking School Workshop sessions, patients will learn about healthy ingredients and efficient methods of cooking to build confidence in their capability to prepare    Cooking School weekly topics:  Adding Flavor- Sodium-Free  Fast and Healthy Breakfasts  Powerhouse Plant-Based Proteins  Satisfying Salads and Dressings  Simple Sides and Sauces  International Cuisine-Spotlight on the [employer] Zones  Delicious Desserts  Savory Soups  [employer] - Meals in a Astronomer Appetizers and Snacks  Comforting Weekend Breakfasts  One-Pot Wonders  Fast Evening Meals  Easy Entertaining  Personalizing Your Pritikin Plate  WORKSHOPS   Healthy Mindset (Psychosocial):  Focused Goals, Sustainable Changes Clinical staff led group instruction and group discussion with PowerPoint presentation and patient guidebook. To enhance the learning environment the use of posters, models and videos may be added. Patients will be able to apply effective goal setting strategies to establish at least one personal goal, and then take consistent, meaningful action toward that goal. They will learn to identify common barriers to achieving personal goals and develop strategies to overcome them. Patients will also gain an understanding of how our mind-set can impact our ability to achieve goals and the importance of cultivating a positive and growth-oriented mind-set. The purpose of this lesson is to provide patients with a deeper understanding of how to set and  achieve personal goals, as well as the tools and strategies needed to overcome common obstacles which may arise along the way.  From Head to Heart: The Power of a Healthy Outlook  Clinical staff led group instruction and group discussion with PowerPoint presentation and patient guidebook. To enhance the learning environment the use of posters, models and videos may be added. Patients will be able to recognize and describe the impact of emotions and mood on physical health. They will discover the importance of self-care and explore self-care practices which may work for them. Patients will also learn how to utilize the 4 C's to cultivate a healthier outlook and better manage stress and challenges. The purpose of this lesson is to demonstrate to patients how a healthy outlook is an essential part of maintaining good health, especially as they continue their cardiac rehab journey.  Healthy Sleep for a Healthy Heart Clinical staff led group instruction and group discussion with PowerPoint presentation and patient guidebook. To enhance the learning environment the use of posters, models and videos may be added. At the conclusion of this workshop, patients will be able to demonstrate knowledge of the importance of sleep to overall health, well-being, and quality of life. They will understand the symptoms of, and treatments for, common sleep disorders. Patients will also be able to identify daytime and nighttime behaviors which impact sleep, and they will be able to apply these tools to help manage sleep-related challenges. The purpose of this lesson is to provide patients with a general overview of sleep and outline the importance of quality sleep. Patients will learn about a few of the most common sleep disorders. Patients will also be introduced to the concept of "sleep hygiene," and discover ways to self-manage certain sleeping problems through simple daily behavior changes. Finally, the workshop will motivate  patients by clarifying the links between quality sleep and their goals of heart-healthy living.   Recognizing and Reducing Stress Clinical staff led group instruction and group discussion with PowerPoint presentation and patient guidebook. To enhance the learning environment the use of posters, models and videos may be added. At the conclusion of this workshop, patients will be able to understand the types of stress reactions, differentiate between acute and chronic stress, and recognize the impact that chronic stress has on their health. They will also be able to apply different coping mechanisms, such as reframing negative self-talk. Patients will have the opportunity to practice a variety of stress management techniques, such as deep abdominal breathing, progressive muscle relaxation, and/or guided imagery.  The purpose of this lesson is to educate patients on the role of stress in their lives and to provide healthy techniques for coping  with it.  Learning Barriers/Preferences:  Learning Barriers/Preferences - 09/21/22 1048       Learning Barriers/Preferences   Learning Barriers Sight   wears glasses   Learning Preferences Written Material;Verbal Instruction;Skilled Demonstration;Pictoral;Individual Instruction;Group Instruction;Computer/Internet             Education Topics:  Knowledge Questionnaire Score:  Knowledge Questionnaire Score - 09/21/22 1048       Knowledge Questionnaire Score   Pre Score 22/28             Core Components/Risk Factors/Patient Goals at Admission:  Personal Goals and Risk Factors at Admission - 09/21/22 0837       Core Components/Risk Factors/Patient Goals on Admission    Weight Management Yes;Weight Maintenance    Intervention Weight Management: Develop a combined nutrition and exercise program designed to reach desired caloric intake, while maintaining appropriate intake of nutrient and fiber, sodium and fats, and appropriate energy expenditure  required for the weight goal.;Weight Management: Provide education and appropriate resources to help participant work on and attain dietary goals.    Expected Outcomes Short Term: Continue to assess and modify interventions until short term weight is achieved;Long Term: Adherence to nutrition and physical activity/exercise program aimed toward attainment of established weight goal;Weight Maintenance: Understanding of the daily nutrition guidelines, which includes 25-35% calories from fat, 7% or less cal from saturated fats, less than 200mg  cholesterol, less than 1.5gm of sodium, & 5 or more servings of fruits and vegetables daily;Understanding recommendations for meals to include 15-35% energy as protein, 25-35% energy from fat, 35-60% energy from carbohydrates, less than 200mg  of dietary cholesterol, 20-35 gm of total fiber daily;Understanding of distribution of calorie intake throughout the day with the consumption of 4-5 meals/snacks    Diabetes Yes    Intervention Provide education about signs/symptoms and action to take for hypo/hyperglycemia.;Provide education about proper nutrition, including hydration, and aerobic/resistive exercise prescription along with prescribed medications to achieve blood glucose in normal ranges: Fasting glucose 65-99 mg/dL    Expected Outcomes Short Term: Participant verbalizes understanding of the signs/symptoms and immediate care of hyper/hypoglycemia, proper foot care and importance of medication, aerobic/resistive exercise and nutrition plan for blood glucose control.;Long Term: Attainment of HbA1C < 7%.    Heart Failure Yes    Intervention Provide a combined exercise and nutrition program that is supplemented with education, support and counseling about heart failure. Directed toward relieving symptoms such as shortness of breath, decreased exercise tolerance, and extremity edema.    Expected Outcomes Improve functional capacity of life;Short term: Attendance in program  2-3 days a week with increased exercise capacity. Reported lower sodium intake. Reported increased fruit and vegetable intake. Reports medication compliance.;Short term: Daily weights obtained and reported for increase. Utilizing diuretic protocols set by physician.;Long term: Adoption of self-care skills and reduction of barriers for early signs and symptoms recognition and intervention leading to self-care maintenance.    Hypertension Yes    Intervention Provide education on lifestyle modifcations including regular physical activity/exercise, weight management, moderate sodium restriction and increased consumption of fresh fruit, vegetables, and low fat dairy, alcohol moderation, and smoking cessation.;Monitor prescription use compliance.    Expected Outcomes Short Term: Continued assessment and intervention until BP is < 140/24mm HG in hypertensive participants. < 130/92mm HG in hypertensive participants with diabetes, heart failure or chronic kidney disease.;Long Term: Maintenance of blood pressure at goal levels.    Lipids Yes    Intervention Provide education and support for participant on nutrition & aerobic/resistive exercise along with  prescribed medications to achieve LDL 70mg , HDL >40mg .    Expected Outcomes Short Term: Participant states understanding of desired cholesterol values and is compliant with medications prescribed. Participant is following exercise prescription and nutrition guidelines.;Long Term: Cholesterol controlled with medications as prescribed, with individualized exercise RX and with personalized nutrition plan. Value goals: LDL < 70mg , HDL > 40 mg.             Core Components/Risk Factors/Patient Goals Review:    Core Components/Risk Factors/Patient Goals at Discharge (Final Review):    ITP Comments:  ITP Comments     Row Name 09/21/22 0835           ITP Comments Dr. Armanda Magic medical dierctor. Introduction to pritikin education/ intensive cardiac  rehab. Initial orientation packet reviewed with patient.                Comments: Participant attended orientation for the cardiac rehabilitation program on  09/21/2022  to perform initial intake and exercise walk test. Patient introduced to the [facility] education and orientation packet was reviewed. Completed 6-minute walk test, measurements, initial ITP, and exercise prescription. Vital signs stable. Telemetry-normal sinus rhythm, RBBB, asymptomatic. Pt resting BP 162/80, repeat L arm 170/80 R arm 164/78. Pt has not been taking his entresto due to concerns regarding potential side effects, RN notified. Pt encouraged to begin entresto as prescribed and told he would not be able to exercise on equipment with BP this high. RN Blake Divine okay with to proceed.   Service time was from 0758 to 1008.   Jonna Coup, MS, ACSM-CEP 09/21/2022 10:53 AM

## 2022-09-23 ENCOUNTER — Ambulatory Visit (HOSPITAL_COMMUNITY): Payer: No Typology Code available for payment source

## 2022-09-25 ENCOUNTER — Ambulatory Visit: Payer: No Typology Code available for payment source | Attending: Nurse Practitioner

## 2022-09-25 ENCOUNTER — Ambulatory Visit (HOSPITAL_COMMUNITY): Payer: No Typology Code available for payment source

## 2022-09-28 ENCOUNTER — Ambulatory Visit (HOSPITAL_COMMUNITY): Payer: No Typology Code available for payment source

## 2022-09-28 ENCOUNTER — Encounter (HOSPITAL_COMMUNITY)
Admission: RE | Admit: 2022-09-28 | Discharge: 2022-09-28 | Disposition: A | Discharge status: Home / Self Care | Payer: No Typology Code available for payment source | Source: Ambulatory Visit | Attending: Interventional Cardiology | Admitting: Interventional Cardiology

## 2022-09-28 DIAGNOSIS — Z9861 Coronary angioplasty status: Secondary | ICD-10-CM

## 2022-09-28 LAB — GLUCOSE, CAPILLARY: Glucose-Capillary: 315 mg/dL — ABNORMAL HIGH (ref 70–99)

## 2022-09-28 NOTE — Progress Notes (Signed)
Cardiac Individual Treatment Plan  Patient Details  Name: Tristan Figueroa MRN: [id number] Date of Birth: 1955/10/09 Referring Provider:   Flowsheet Row INTENSIVE CARDIAC REHAB ORIENT from 09/21/2022 in [facility]  Referring Provider Lance Muss,  MD       Initial Encounter Date:  Flowsheet Row INTENSIVE CARDIAC REHAB ORIENT from 09/21/2022 in [facility]  Date 09/21/22       Visit Diagnosis: 5/15 DES LAD  Patient's Home Medications on Admission:  Current Outpatient Medications:    acetaminophen (TYLENOL) 500 MG tablet, Take 1,000 mg by mouth every 6 (six) hours as needed. For pain., Disp: , Rfl:    aspirin EC 81 MG tablet, Take 81 mg by mouth daily., Disp: , Rfl:    carvedilol (COREG) 3.125 MG tablet, Take 1 tablet (3.125 mg total) by mouth 2 (two) times daily with a meal., Disp: 60 tablet, Rfl: 5   clopidogrel (PLAVIX) 75 MG tablet, Take 75 mg by mouth daily., Disp: , Rfl:    ergocalciferol (VITAMIN D2) 50000 UNITS capsule, Take 1 capsule (50,000 Units total) by mouth once a week., Disp: 12 capsule, Rfl: 0   HYDROPHILIC EX, Apply 1 Application topically every Friday., Disp: , Rfl:    insulin glargine-yfgn (SEMGLEE) 100 UNIT/ML injection, Inject 34 Units into the skin every evening., Disp: , Rfl:    metFORMIN (GLUCOPHAGE) 500 MG tablet, Take 500 mg by mouth daily with breakfast. Takes in the evening, Disp: , Rfl:    nitroGLYCERIN (NITROSTAT) 0.4 MG SL tablet, Place 1 tablet (0.4 mg total) under the tongue every 5 (five) minutes as needed for chest pain (up to 3 doses)., Disp: 25 tablet, Rfl: 3   rosuvastatin (CRESTOR) 20 MG tablet, Take 20 mg by mouth daily., Disp: , Rfl:    sacubitril-valsartan (ENTRESTO) 49-51 MG, Take 1 tablet by mouth 2 (two) times daily., Disp: 180 tablet, Rfl: 3  Past Medical History: Past Medical History:  Diagnosis Date   Diabetes  mellitus    type 2   Hyperlipidemia    Hypertension    SOB (shortness of breath)     Tobacco Use: Social History   Tobacco Use  Smoking Status Every Day   Packs/day: 0.50   Years: 1.00   Additional pack years: 0.00   Total pack years: 0.50   Types: Cigarettes  Smokeless Tobacco Never    Labs: Review Flowsheet  More data exists      Latest Ref Rng & Units 08/30/2009 02/28/2010 01/07/2011 07/01/2012 02/18/2022  Labs for ITP Cardiac and Pulmonary Rehab  Cholestrol 0 - 200 mg/dL - 409  811  914  -  LDL (calc) 0 - 99 mg/dL - 84  782  - -  Direct LDL mg/dL - - - 956.2  -  HDL-C >13.08 mg/dL - 65.78  46.96  29.52  -  Trlycerides 0.0 - 149.0 mg/dL - 84.1  32.4  40.1  -  Hemoglobin A1c 4.8 - 5.6 % 7.0  7.2  7.2  9.0  10.0     Capillary Blood Glucose: Lab Results  Component Value Date   GLUCAP 315 (H) 09/28/2022   GLUCAP 260 (H) 09/21/2022   GLUCAP 96 08/12/2022   GLUCAP 121 (H) 08/12/2022   GLUCAP 149 (H) 11/28/2011     Exercise Target Goals: Exercise Program Goal: Individual exercise prescription set using results from initial 6 min walk test and THRR while  considering  patient's activity barriers and safety.   Exercise Prescription Goal: Initial exercise prescription builds to 30-45 minutes a day of aerobic activity, 2-3 days per week.  Home exercise guidelines will be given to patient during program as part of exercise prescription that the participant will acknowledge.  Activity Barriers & Risk Stratification:  Activity Barriers & Cardiac Risk Stratification - 09/21/22 1039       Activity Barriers & Cardiac Risk Stratification   Activity Barriers Left Hip Replacement;Right Hip Replacement;Balance Concerns;Decreased Ventricular Function;Joint Problems    Cardiac Risk Stratification High   <5 METs on            6 Minute Walk:  6 Minute Walk     Row Name 09/21/22 1038         6 Minute Walk   Phase Initial     Distance 1365 feet     Walk Time 6  minutes     # of Rest Breaks 0     MPH 2.85     METS 3.42     RPE 11     Perceived Dyspnea  0     VO2 Peak 11.96     Symptoms No     Resting HR 77 bpm     Resting BP 162/80     Resting Oxygen Saturation  98 %     Exercise Oxygen Saturation  during 6 min walk 99 %     Max Ex. HR 102 bpm     Max Ex. BP 162/78     2 Minute Post BP 148/80              Oxygen Initial Assessment:   Oxygen Re-Evaluation:   Oxygen Discharge (Final Oxygen Re-Evaluation):   Initial Exercise Prescription:  Initial Exercise Prescription - 09/21/22 1000       Date of Initial Exercise RX and Referring Provider   Date 09/21/22    Referring Provider Lance Muss,  MD    Expected Discharge Date 12/04/22      Recumbant Bike   Level 2    RPM 60    Watts 30    Minutes 15    METs 3      Arm Ergometer   Level 1    Watts 20    RPM 50    Minutes 15    METs 3      Prescription Details   Frequency (times per week) 3    Duration Progress to 30 minutes of continuous aerobic without signs/symptoms of physical distress      Intensity   THRR 40-80% of Max Heartrate 60-122    Ratings of Perceived Exertion 11-13    Perceived Dyspnea 0-4      Progression   Progression Continue progressive overload as per policy without signs/symptoms or physical distress.      Resistance Training   Training Prescription Yes    Weight 3    Reps 10-15             Perform Capillary Blood Glucose checks as needed.  Exercise Prescription Changes:   Exercise Comments:   Exercise Goals and Review:   Exercise Goals     Row Name 09/21/22 0837             Exercise Goals   Increase Physical Activity Yes       Intervention Provide advice, education, support and counseling about physical activity/exercise needs.;Develop an individualized exercise prescription for aerobic and resistive training based  on initial evaluation findings, risk stratification, comorbidities and participant's personal  goals.       Expected Outcomes Short Term: Attend rehab on a regular basis to increase amount of physical activity.;Long Term: Exercising regularly at least 3-5 days a week.;Long Term: Add in home exercise to make exercise part of routine and to increase amount of physical activity.       Increase Strength and Stamina Yes       Intervention Provide advice, education, support and counseling about physical activity/exercise needs.;Develop an individualized exercise prescription for aerobic and resistive training based on initial evaluation findings, risk stratification, comorbidities and participant's personal goals.       Expected Outcomes Short Term: Increase workloads from initial exercise prescription for resistance, speed, and METs.;Short Term: Perform resistance training exercises routinely during rehab and add in resistance training at home;Long Term: Improve cardiorespiratory fitness, muscular endurance and strength as measured by increased METs and functional capacity ( )       Able to understand and use rate of perceived exertion (RPE) scale Yes       Intervention Provide education and explanation on how to use RPE scale       Expected Outcomes Short Term: Able to use RPE daily in rehab to express subjective intensity level;Long Term:  Able to use RPE to guide intensity level when exercising independently       Knowledge and understanding of Target Heart Rate Range (THRR) Yes       Intervention Provide education and explanation of THRR including how the numbers were predicted and where they are located for reference       Expected Outcomes Short Term: Able to state/look up THRR;Short Term: Able to use daily as guideline for intensity in rehab;Long Term: Able to use THRR to govern intensity when exercising independently       Understanding of Exercise Prescription Yes       Intervention Provide education, explanation, and written materials on patient's individual exercise prescription        Expected Outcomes Short Term: Able to explain program exercise prescription;Long Term: Able to explain home exercise prescription to exercise independently                Exercise Goals Re-Evaluation :   Discharge Exercise Prescription (Final Exercise Prescription Changes):   Nutrition:  Target Goals: Understanding of nutrition guidelines, daily intake of sodium 1500mg , cholesterol 200mg , calories 30% from fat and 7% or less from saturated fats, daily to have 5 or more servings of fruits and vegetables.  Biometrics:  Pre Biometrics - 09/21/22 0836       Pre Biometrics   Waist Circumference 40 inches    Hip Circumference 39 inches    Waist to Hip Ratio 1.03 %    Triceps Skinfold 16 mm    % Body Fat 27.9 %    Grip Strength 34 kg    Flexibility 10 in    Single Leg Stand 2.81 seconds              Nutrition Therapy Plan and Nutrition Goals:   Nutrition Assessments:  MEDIFICTS Score Key: ?70 Need to make dietary changes  40-70 Heart Healthy Diet ? 40 Therapeutic Level Cholesterol Diet    Picture Your Plate Scores: <86 Unhealthy dietary pattern with much room for improvement. 41-50 Dietary pattern unlikely to meet recommendations for good health and room for improvement. 51-60 More healthful dietary pattern, with some room for improvement.  >60 Healthy dietary pattern, although there  may be some specific behaviors that could be improved.    Nutrition Goals Re-Evaluation:   Nutrition Goals Re-Evaluation:   Nutrition Goals Discharge (Final Nutrition Goals Re-Evaluation):   Psychosocial: Target Goals: Acknowledge presence or absence of significant depression and/or stress, maximize coping skills, provide positive support system. Participant is able to verbalize types and ability to use techniques and skills needed for reducing stress and depression.  Initial Review & Psychosocial Screening:  Initial Psych Review & Screening - 09/21/22 1046       Initial  Review   Current issues with None Identified      Family Dynamics   Good Support System? Yes   Lukis has his wife and 2 children for support     Barriers   Psychosocial barriers to participate in program There are no identifiable barriers or psychosocial needs.      Screening Interventions   Interventions Encouraged to exercise;Provide feedback about the scores to participant    Expected Outcomes Short Term goal: Identification and review with participant of any Quality of Life or Depression concerns found by scoring the questionnaire.;Long Term goal: The participant improves quality of Life and PHQ9 Scores as seen by post scores and/or verbalization of changes             Quality of Life Scores:  Quality of Life - 09/21/22 1046       Quality of Life   Select Quality of Life      Quality of Life Scores   Health/Function Pre 27.57 %    Socioeconomic Pre 28.5 %    Psych/Spiritual Pre 27.75 %    Family Pre 30 %    GLOBAL Pre 28.15 %            Scores of 19 and below usually indicate a poorer quality of life in these areas.  A difference of  2-3 points is a clinically meaningful difference.  A difference of 2-3 points in the total score of the Quality of Life Index has been associated with significant improvement in overall quality of life, self-image, physical symptoms, and general health in studies assessing change in quality of life.  PHQ-9: Review Flowsheet       09/21/2022  Depression screen PHQ 2/9  Decreased Interest 0  Down, Depressed, Hopeless 0  PHQ - 2 Score 0  Altered sleeping 0  Tired, decreased energy 0  Change in appetite 0  Feeling bad or failure about yourself  0  Trouble concentrating 0  Moving slowly or fidgety/restless 0  Suicidal thoughts 0  PHQ-9 Score 0   Interpretation of Total Score  Total Score Depression Severity:  1-4 = Minimal depression, 5-9 = Mild depression, 10-14 = Moderate depression, 15-19 = Moderately severe depression, 20-27  = Severe depression   Psychosocial Evaluation and Intervention:   Psychosocial Re-Evaluation:   Psychosocial Discharge (Final Psychosocial Re-Evaluation):   Vocational Rehabilitation: Provide vocational rehab assistance to qualifying candidates.   Vocational Rehab Evaluation & Intervention:  Vocational Rehab - 09/21/22 1049       Initial Vocational Rehab Evaluation & Intervention   Assessment shows need for Vocational Rehabilitation No   Natalio is currently working            Education: Education Goals: Education classes will be provided on a weekly basis, covering required topics. Participant will state understanding/return demonstration of topics presented.    Education     Row Name 09/28/22 0900     Education   Cardiac Education Topics  Pritikin   Licensed conveyancer Nutrition   Nutrition Calorie Density   Instruction Review Code 1- Verbalizes Understanding   Class Start Time 0815   Class Stop Time 0857   Class Time Calculation (min) 42 min            Core Videos: Exercise    Move It!  Clinical staff conducted group or individual video education with verbal and written material and guidebook.  Patient learns the recommended Pritikin exercise program. Exercise with the goal of living a long, healthy life. Some of the health benefits of exercise include controlled diabetes, healthier blood pressure levels, improved cholesterol levels, improved heart and lung capacity, improved sleep, and better body composition. Everyone should speak with their doctor before starting or changing an exercise routine.  Biomechanical Limitations Clinical staff conducted group or individual video education with verbal and written material and guidebook.  Patient learns how biomechanical limitations can impact exercise and how we can mitigate and possibly overcome limitations to have an impactful and balanced exercise routine.  Body  Composition Clinical staff conducted group or individual video education with verbal and written material and guidebook.  Patient learns that body composition (ratio of muscle mass to fat mass) is a key component to assessing overall fitness, rather than body weight alone. Increased fat mass, especially visceral belly fat, can put [place] at increased risk for metabolic syndrome, type 2 diabetes, heart disease, and even death. It is recommended to combine diet and exercise (cardiovascular and resistance training) to improve your body composition. Seek guidance from your physician and exercise physiologist before implementing an exercise routine.  Exercise Action Plan Clinical staff conducted group or individual video education with verbal and written material and guidebook.  Patient learns the recommended strategies to achieve and enjoy long-term exercise adherence, including variety, self-motivation, self-efficacy, and positive decision making. Benefits of exercise include fitness, good health, weight management, more energy, better sleep, less stress, and overall well-being.  Medical   Heart Disease Risk Reduction Clinical staff conducted group or individual video education with verbal and written material and guidebook.  Patient learns our heart is our most vital organ as it circulates oxygen, nutrients, white blood cells, and hormones throughout the entire body, and carries waste away. Data supports a plant-based eating plan like the Pritikin Program for its effectiveness in slowing progression of and reversing heart disease. The video provides a number of recommendations to address heart disease.   Metabolic Syndrome and Belly Fat  Clinical staff conducted group or individual video education with verbal and written material and guidebook.  Patient learns what metabolic syndrome is, how it leads to heart disease, and how one can reverse it and keep it from coming back. You have metabolic syndrome if  you have 3 of the following 5 criteria: abdominal obesity, high blood pressure, high triglycerides, low HDL cholesterol, and high blood sugar.  Hypertension and Heart Disease Clinical staff conducted group or individual video education with verbal and written material and guidebook.  Patient learns that high blood pressure, or hypertension, is very common in the [place]. Hypertension is largely due to excessive salt intake, but other important risk factors include being overweight, physical inactivity, drinking too much alcohol, smoking, and not eating enough potassium from fruits and vegetables. High blood pressure is a leading risk factor for heart attack, stroke, congestive heart failure, dementia, kidney failure, and premature death. Long-term effects of excessive  salt intake include stiffening of the arteries and thickening of heart muscle and organ damage. Recommendations include ways to reduce hypertension and the risk of heart disease.  Diseases of Our Time - Focusing on Diabetes Clinical staff conducted group or individual video education with verbal and written material and guidebook.  Patient learns why the best way to stop diseases of our time is prevention, through food and other lifestyle changes. Medicine (such as prescription pills and surgeries) is often only a Band-Aid on the problem, not a long-term solution. Most common diseases of our time include obesity, type 2 diabetes, hypertension, heart disease, and cancer. The Pritikin Program is recommended and has been proven to help reduce, reverse, and/or prevent the damaging effects of metabolic syndrome.  Nutrition   Overview of the Pritikin Eating Plan  Clinical staff conducted group or individual video education with verbal and written material and guidebook.  Patient learns about the Pritikin Eating Plan for disease risk reduction. The Pritikin Eating Plan emphasizes a wide variety of unrefined, minimally-processed  carbohydrates, like fruits, vegetables, whole grains, and legumes. Go, Caution, and Stop food choices are explained. Plant-based and lean animal proteins are emphasized. Rationale provided for low sodium intake for blood pressure control, low added sugars for blood sugar stabilization, and low added fats and oils for coronary artery disease risk reduction and weight management.  Calorie Density  Clinical staff conducted group or individual video education with verbal and written material and guidebook.  Patient learns about calorie density and how it impacts the Pritikin Eating Plan. Knowing the characteristics of the food you choose will help you decide whether those foods will lead to weight gain or weight loss, and whether you want to consume more or less of them. Weight loss is usually a side effect of the Pritikin Eating Plan because of its focus on low calorie-dense foods.  Label Reading  Clinical staff conducted group or individual video education with verbal and written material and guidebook.  Patient learns about the Pritikin recommended label reading guidelines and corresponding recommendations regarding calorie density, added sugars, sodium content, and whole grains.  Dining Out - Part 1  Clinical staff conducted group or individual video education with verbal and written material and guidebook.  Patient learns that restaurant meals can be sabotaging because they can be so high in calories, fat, sodium, and/or sugar. Patient learns recommended strategies on how to positively address this and avoid unhealthy pitfalls.  Facts on Fats  Clinical staff conducted group or individual video education with verbal and written material and guidebook.  Patient learns that lifestyle modifications can be just as effective, if not more so, as many medications for lowering your risk of heart disease. A Pritikin lifestyle can help to reduce your risk of inflammation and atherosclerosis (cholesterol  build-up, or plaque, in the artery walls). Lifestyle interventions such as dietary choices and physical activity address the cause of atherosclerosis. A review of the types of fats and their impact on blood cholesterol levels, along with dietary recommendations to reduce fat intake is also included.  Nutrition Action Plan  Clinical staff conducted group or individual video education with verbal and written material and guidebook.  Patient learns how to incorporate Pritikin recommendations into their lifestyle. Recommendations include planning and keeping personal health goals in mind as an important part of their success.  Healthy Mind-Set    Healthy Minds, Bodies, Hearts  Clinical staff conducted group or individual video education with verbal and written material and guidebook.  Patient learns how to identify when they are stressed. Video will discuss the impact of that stress, as well as the many benefits of stress management. Patient will also be introduced to stress management techniques. The way we think, act, and feel has an impact on our hearts.  How Our Thoughts Can Heal Our Hearts  Clinical staff conducted group or individual video education with verbal and written material and guidebook.  Patient learns that negative thoughts can cause depression and anxiety. This can result in negative lifestyle behavior and serious health problems. Cognitive behavioral therapy is an effective method to help control our thoughts in order to change and improve our emotional outlook.  Additional Videos:  Exercise    Improving Performance  Clinical staff conducted group or individual video education with verbal and written material and guidebook.  Patient learns to use a non-linear approach by alternating intensity levels and lengths of time spent exercising to help burn more calories and lose more body fat. Cardiovascular exercise helps improve heart health, metabolism, hormonal balance, blood sugar  control, and recovery from fatigue. Resistance training improves strength, endurance, balance, coordination, reaction time, metabolism, and muscle mass. Flexibility exercise improves circulation, posture, and balance. Seek guidance from your physician and exercise physiologist before implementing an exercise routine and learn your capabilities and proper form for all exercise.  Introduction to Yoga  Clinical staff conducted group or individual video education with verbal and written material and guidebook.  Patient learns about yoga, a discipline of the coming together of mind, breath, and body. The benefits of yoga include improved flexibility, improved range of motion, better posture and core strength, increased lung function, weight loss, and positive self-image. Yoga's heart health benefits include lowered blood pressure, healthier heart rate, decreased cholesterol and triglyceride levels, improved immune function, and reduced stress. Seek guidance from your physician and exercise physiologist before implementing an exercise routine and learn your capabilities and proper form for all exercise.  Medical   Aging: Enhancing Your Quality of Life  Clinical staff conducted group or individual video education with verbal and written material and guidebook.  Patient learns key strategies and recommendations to stay in good physical health and enhance quality of life, such as prevention strategies, having an advocate, securing a Health Care Proxy and Power of Attorney, and keeping a list of medications and system for tracking them. It also discusses how to avoid risk for bone loss.  Biology of Weight Control  Clinical staff conducted group or individual video education with verbal and written material and guidebook.  Patient learns that weight gain occurs because we consume more calories than we burn (eating more, moving less). Even if your body weight is normal, you may have higher ratios of fat compared to  muscle mass. Too much body fat puts you at increased risk for cardiovascular disease, heart attack, stroke, type 2 diabetes, and obesity-related cancers. In addition to exercise, following the Pritikin Eating Plan can help reduce your risk.  Decoding Lab Results  Clinical staff conducted group or individual video education with verbal and written material and guidebook.  Patient learns that lab test reflects one measurement whose values change over time and are influenced by many factors, including medication, stress, sleep, exercise, food, hydration, pre-existing medical conditions, and more. It is recommended to use the knowledge from this video to become more involved with your lab results and evaluate your numbers to speak with your doctor.   Diseases of Our Time - Overview  Clinical staff conducted  group or individual video education with verbal and written material and guidebook.  Patient learns that according to the CDC, 50% to 70% of chronic diseases (such as obesity, type 2 diabetes, elevated lipids, hypertension, and heart disease) are avoidable through lifestyle improvements including healthier food choices, listening to satiety cues, and increased physical activity.  Sleep Disorders Clinical staff conducted group or individual video education with verbal and written material and guidebook.  Patient learns how good quality and duration of sleep are important to overall health and well-being. Patient also learns about sleep disorders and how they impact health along with recommendations to address them, including discussing with a physician.  Nutrition  Dining Out - Part 2 Clinical staff conducted group or individual video education with verbal and written material and guidebook.  Patient learns how to plan ahead and communicate in order to maximize their dining experience in a healthy and nutritious manner. Included are recommended food choices based on the type of restaurant the patient  is visiting.   Fueling a Banker conducted group or individual video education with verbal and written material and guidebook.  There is a strong connection between our food choices and our health. Diseases like obesity and type 2 diabetes are very prevalent and are in large-part due to lifestyle choices. The Pritikin Eating Plan provides plenty of food and hunger-curbing satisfaction. It is easy to follow, affordable, and helps reduce health risks.  Menu Workshop  Clinical staff conducted group or individual video education with verbal and written material and guidebook.  Patient learns that restaurant meals can sabotage health goals because they are often packed with calories, fat, sodium, and sugar. Recommendations include strategies to plan ahead and to communicate with the manager, chef, or server to help order a healthier meal.  Planning Your Eating Strategy  Clinical staff conducted group or individual video education with verbal and written material and guidebook.  Patient learns about the Pritikin Eating Plan and its benefit of reducing the risk of disease. The Pritikin Eating Plan does not focus on calories. Instead, it emphasizes high-quality, nutrient-rich foods. By knowing the characteristics of the foods, we choose, we can determine their calorie density and make informed decisions.  Targeting Your Nutrition Priorities  Clinical staff conducted group or individual video education with verbal and written material and guidebook.  Patient learns that lifestyle habits have a tremendous impact on disease risk and progression. This video provides eating and physical activity recommendations based on your personal health goals, such as reducing LDL cholesterol, losing weight, preventing or controlling type 2 diabetes, and reducing high blood pressure.  Vitamins and Minerals  Clinical staff conducted group or individual video education with verbal and written material  and guidebook.  Patient learns different ways to obtain key vitamins and minerals, including through a recommended healthy diet. It is important to discuss all supplements you take with your doctor.   Healthy Mind-Set    Smoking Cessation  Clinical staff conducted group or individual video education with verbal and written material and guidebook.  Patient learns that cigarette smoking and tobacco addiction pose a serious health risk which affects millions of people. Stopping smoking will significantly reduce the risk of heart disease, lung disease, and many forms of cancer. Recommended strategies for quitting are covered, including working with your doctor to develop a successful plan.  Culinary   Becoming a Set designer conducted group or individual video education with verbal and written material and guidebook.  Patient learns that cooking at home can be healthy, cost-effective, quick, and puts them in control. Keys to cooking healthy recipes will include looking at your recipe, assessing your equipment needs, planning ahead, making it simple, choosing cost-effective seasonal ingredients, and limiting the use of added fats, salts, and sugars.  Cooking - Breakfast and Snacks  Clinical staff conducted group or individual video education with verbal and written material and guidebook.  Patient learns how important breakfast is to satiety and nutrition through the entire day. Recommendations include key foods to eat during breakfast to help stabilize blood sugar levels and to prevent overeating at meals later in the day. Planning ahead is also a key component.  Cooking - Educational psychologist conducted group or individual video education with verbal and written material and guidebook.  Patient learns eating strategies to improve overall health, including an approach to cook more at home. Recommendations include thinking of animal protein as a side on your plate rather  than center stage and focusing instead on lower calorie dense options like vegetables, fruits, whole grains, and plant-based proteins, such as beans. Making sauces in large quantities to freeze for later and leaving the skin on your vegetables are also recommended to maximize your experience.  Cooking - Healthy Salads and Dressing Clinical staff conducted group or individual video education with verbal and written material and guidebook.  Patient learns that vegetables, fruits, whole grains, and legumes are the foundations of the Pritikin Eating Plan. Recommendations include how to incorporate each of these in flavorful and healthy salads, and how to create homemade salad dressings. Proper handling of ingredients is also covered. Cooking - Soups and [employer] - Soups and Desserts Clinical staff conducted group or individual video education with verbal and written material and guidebook.  Patient learns that Pritikin soups and desserts make for easy, nutritious, and delicious snacks and meal components that are low in sodium, fat, sugar, and calorie density, while high in vitamins, minerals, and filling fiber. Recommendations include simple and healthy ideas for soups and desserts.   Overview     The Pritikin Solution Program Overview Clinical staff conducted group or individual video education with verbal and written material and guidebook.  Patient learns that the results of the Pritikin Program have been documented in more than 100 articles published in peer-reviewed journals, and the benefits include reducing risk factors for (and, in some cases, even reversing) high cholesterol, high blood pressure, type 2 diabetes, obesity, and more! An overview of the three key pillars of the Pritikin Program will be covered: eating well, doing regular exercise, and having a healthy mind-set.  WORKSHOPS  Exercise: Exercise Basics: Building Your Action Plan Clinical staff led group instruction and  group discussion with PowerPoint presentation and patient guidebook. To enhance the learning environment the use of posters, models and videos may be added. At the conclusion of this workshop, patients will comprehend the difference between physical activity and exercise, as well as the benefits of incorporating both, into their routine. Patients will understand the FITT (Frequency, Intensity, Time, and Type) principle and how to use it to build an exercise action plan. In addition, safety concerns and other considerations for exercise and cardiac rehab will be addressed by the presenter. The purpose of this lesson is to promote a comprehensive and effective weekly exercise routine in order to improve patients' overall level of fitness.   Managing Heart Disease: Your Path to a Healthier Heart Clinical staff led group instruction  and group discussion with PowerPoint presentation and patient guidebook. To enhance the learning environment the use of posters, models and videos may be added.At the conclusion of this workshop, patients will understand the anatomy and physiology of the heart. Additionally, they will understand how Pritikin's three pillars impact the risk factors, the progression, and the management of heart disease.  The purpose of this lesson is to provide a high-level overview of the heart, heart disease, and how the Pritikin lifestyle positively impacts risk factors.  Exercise Biomechanics Clinical staff led group instruction and group discussion with PowerPoint presentation and patient guidebook. To enhance the learning environment the use of posters, models and videos may be added. Patients will learn how the structural parts of their bodies function and how these functions impact their daily activities, movement, and exercise. Patients will learn how to promote a neutral spine, learn how to manage pain, and identify ways to improve their physical movement in order to  promote healthy living. The purpose of this lesson is to expose patients to common physical limitations that impact physical activity. Participants will learn practical ways to adapt and manage aches and pains, and to minimize their effect on regular exercise. Patients will learn how to maintain good posture while sitting, walking, and lifting.  Balance Training and Fall Prevention  Clinical staff led group instruction and group discussion with PowerPoint presentation and patient guidebook. To enhance the learning environment the use of posters, models and videos may be added. At the conclusion of this workshop, patients will understand the importance of their sensorimotor skills (vision, proprioception, and the vestibular system) in maintaining their ability to balance as they age. Patients will apply a variety of balancing exercises that are appropriate for their current level of function. Patients will understand the common causes for poor balance, possible solutions to these problems, and ways to modify their physical environment in order to minimize their fall risk. The purpose of this lesson is to teach patients about the importance of maintaining balance as they age and ways to minimize their risk of falling.  WORKSHOPS   Nutrition:  Fueling a Ship broker led group instruction and group discussion with PowerPoint presentation and patient guidebook. To enhance the learning environment the use of posters, models and videos may be added. Patients will review the foundational principles of the Pritikin Eating Plan and understand what constitutes a serving size in each of the food groups. Patients will also learn Pritikin-friendly foods that are better choices when away from home and review make-ahead meal and snack options. Calorie density will be reviewed and applied to three nutrition priorities: weight maintenance, weight loss, and weight gain. The purpose of this lesson is to  reinforce (in a group setting) the key concepts around what patients are recommended to eat and how to apply these guidelines when away from home by planning and selecting Pritikin-friendly options. Patients will understand how calorie density may be adjusted for different weight management goals.  Mindful Eating  Clinical staff led group instruction and group discussion with PowerPoint presentation and patient guidebook. To enhance the learning environment the use of posters, models and videos may be added. Patients will briefly review the concepts of the Pritikin Eating Plan and the importance of low-calorie dense foods. The concept of mindful eating will be introduced as well as the importance of paying attention to internal hunger signals. Triggers for non-hunger eating and techniques for dealing with triggers will be explored. The purpose of this lesson is  to provide patients with the opportunity to review the basic principles of the Pritikin Eating Plan, discuss the value of eating mindfully and how to measure internal cues of hunger and fullness using the Hunger Scale. Patients will also discuss reasons for non-hunger eating and learn strategies to use for controlling emotional eating.  Targeting Your Nutrition Priorities Clinical staff led group instruction and group discussion with PowerPoint presentation and patient guidebook. To enhance the learning environment the use of posters, models and videos may be added. Patients will learn how to determine their genetic susceptibility to disease by reviewing their family history. Patients will gain insight into the importance of diet as part of an overall healthy lifestyle in mitigating the impact of genetics and other environmental insults. The purpose of this lesson is to provide patients with the opportunity to assess their personal nutrition priorities by looking at their family history, their own health history and current risk factors. Patients will  also be able to discuss ways of prioritizing and modifying the Pritikin Eating Plan for their highest risk areas  Menu  Clinical staff led group instruction and group discussion with PowerPoint presentation and patient guidebook. To enhance the learning environment the use of posters, models and videos may be added. Using menus brought in from [employer], or printed from [employer], patients will apply the Pritikin dining out guidelines that were presented in the [employer] video. Patients will also be able to practice these guidelines in a variety of provided scenarios. The purpose of this lesson is to provide patients with the opportunity to practice hands-on learning of the Pritikin Dining Out guidelines with actual menus and practice scenarios.  Label Reading Clinical staff led group instruction and group discussion with PowerPoint presentation and patient guidebook. To enhance the learning environment the use of posters, models and videos may be added. Patients will review and discuss the Pritikin label reading guidelines presented in Pritikin's Label Reading Educational series video. Using fool labels brought in from local grocery stores and markets, patients will apply the label reading guidelines and determine if the packaged food meet the Pritikin guidelines. The purpose of this lesson is to provide patients with the opportunity to review, discuss, and practice hands-on learning of the Pritikin Label Reading guidelines with actual packaged food labels. Cooking School  Pritikin's [employer] are designed to teach patients ways to prepare quick, simple, and affordable recipes at home. The importance of nutrition's role in chronic disease risk reduction is reflected in its emphasis in the overall Pritikin program. By learning how to prepare essential core Pritikin Eating Plan recipes, patients will increase control over what they eat; be able to customize the  flavor of foods without the use of added salt, sugar, or fat; and improve the quality of the food they consume. By learning a set of core recipes which are easily assembled, quickly prepared, and affordable, patients are more likely to prepare more healthy foods at home. These workshops focus on convenient breakfasts, simple entres, side dishes, and desserts which can be prepared with minimal effort and are consistent with nutrition recommendations for cardiovascular risk reduction. Cooking [employer] are taught by a Armed forces logistics/support/administrative officer (RD) who has been trained by the [employer]. The chef or RD has a clear understanding of the importance of minimizing - if not completely eliminating - added fat, sugar, and sodium in recipes. Throughout the series of Cooking School Workshop sessions, patients will learn about healthy  ingredients and efficient methods of cooking to build confidence in their capability to prepare    Cooking School weekly topics:  Adding Flavor- Sodium-Free  Fast and Healthy Breakfasts  Powerhouse Plant-Based Proteins  Satisfying Salads and Dressings  Simple Sides and Sauces  International Cuisine-Spotlight on the [employer] Zones  Delicious Desserts  Savory Soups  [employer] - Meals in a Snap  Tasty Appetizers and Snacks  Comforting Weekend Breakfasts  One-Pot Wonders   Fast Evening Meals  Landscape architect Your Pritikin Plate  WORKSHOPS   Healthy Mindset (Psychosocial):  Focused Goals, Sustainable Changes Clinical staff led group instruction and group discussion with PowerPoint presentation and patient guidebook. To enhance the learning environment the use of posters, models and videos may be added. Patients will be able to apply effective goal setting strategies to establish at least one personal goal, and then take consistent, meaningful action toward that goal. They will learn to identify common barriers to achieving  personal goals and develop strategies to overcome them. Patients will also gain an understanding of how our mind-set can impact our ability to achieve goals and the importance of cultivating a positive and growth-oriented mind-set. The purpose of this lesson is to provide patients with a deeper understanding of how to set and achieve personal goals, as well as the tools and strategies needed to overcome common obstacles which may arise along the way.  From Head to Heart: The Power of a Healthy Outlook  Clinical staff led group instruction and group discussion with PowerPoint presentation and patient guidebook. To enhance the learning environment the use of posters, models and videos may be added. Patients will be able to recognize and describe the impact of emotions and mood on physical health. They will discover the importance of self-care and explore self-care practices which may work for them. Patients will also learn how to utilize the 4 C's to cultivate a healthier outlook and better manage stress and challenges. The purpose of this lesson is to demonstrate to patients how a healthy outlook is an essential part of maintaining good health, especially as they continue their cardiac rehab journey.  Healthy Sleep for a Healthy Heart Clinical staff led group instruction and group discussion with PowerPoint presentation and patient guidebook. To enhance the learning environment the use of posters, models and videos may be added. At the conclusion of this workshop, patients will be able to demonstrate knowledge of the importance of sleep to overall health, well-being, and quality of life. They will understand the symptoms of, and treatments for, common sleep disorders. Patients will also be able to identify daytime and nighttime behaviors which impact sleep, and they will be able to apply these tools to help manage sleep-related challenges. The purpose of this lesson is to provide patients with a general  overview of sleep and outline the importance of quality sleep. Patients will learn about a few of the most common sleep disorders. Patients will also be introduced to the concept of "sleep hygiene," and discover ways to self-manage certain sleeping problems through simple daily behavior changes. Finally, the workshop will motivate patients by clarifying the links between quality sleep and their goals of heart-healthy living.   Recognizing and Reducing Stress Clinical staff led group instruction and group discussion with PowerPoint presentation and patient guidebook. To enhance the learning environment the use of posters, models and videos may be added. At the conclusion of this workshop, patients will be able to understand the types of stress reactions, differentiate between  acute and chronic stress, and recognize the impact that chronic stress has on their health. They will also be able to apply different coping mechanisms, such as reframing negative self-talk. Patients will have the opportunity to practice a variety of stress management techniques, such as deep abdominal breathing, progressive muscle relaxation, and/or guided imagery.  The purpose of this lesson is to educate patients on the role of stress in their lives and to provide healthy techniques for coping with it.  Learning Barriers/Preferences:  Learning Barriers/Preferences - 09/21/22 1048       Learning Barriers/Preferences   Learning Barriers Sight   wears glasses   Learning Preferences Written Material;Verbal Instruction;Skilled Demonstration;Pictoral;Individual Instruction;Group Instruction;Computer/Internet             Education Topics:  Knowledge Questionnaire Score:  Knowledge Questionnaire Score - 09/21/22 1048       Knowledge Questionnaire Score   Pre Score 22/28             Core Components/Risk Factors/Patient Goals at Admission:  Personal Goals and Risk Factors at Admission - 09/21/22 0837       Core  Components/Risk Factors/Patient Goals on Admission    Weight Management Yes;Weight Maintenance    Intervention Weight Management: Develop a combined nutrition and exercise program designed to reach desired caloric intake, while maintaining appropriate intake of nutrient and fiber, sodium and fats, and appropriate energy expenditure required for the weight goal.;Weight Management: Provide education and appropriate resources to help participant work on and attain dietary goals.    Expected Outcomes Short Term: Continue to assess and modify interventions until short term weight is achieved;Long Term: Adherence to nutrition and physical activity/exercise program aimed toward attainment of established weight goal;Weight Maintenance: Understanding of the daily nutrition guidelines, which includes 25-35% calories from fat, 7% or less cal from saturated fats, less than 200mg  cholesterol, less than 1.5gm of sodium, & 5 or more servings of fruits and vegetables daily;Understanding recommendations for meals to include 15-35% energy as protein, 25-35% energy from fat, 35-60% energy from carbohydrates, less than 200mg  of dietary cholesterol, 20-35 gm of total fiber daily;Understanding of distribution of calorie intake throughout the day with the consumption of 4-5 meals/snacks    Tobacco Cessation Yes    Number of packs per day 0.5    Intervention Assist the participant in steps to quit. Provide individualized education and counseling about committing to Tobacco Cessation, relapse prevention, and pharmacological support that can be provided by physician.;Education officer, environmental, assist with locating and accessing local/national Quit Smoking programs, and support quit date choice.    Expected Outcomes Short Term: Will demonstrate readiness to quit, by selecting a quit date.;Long Term: Complete abstinence from all tobacco products for at least 12 months from quit date.;Short Term: Will quit all tobacco product use,  adhering to prevention of relapse plan.   given pamplet on [place] quitline   Diabetes Yes    Intervention Provide education about signs/symptoms and action to take for hypo/hyperglycemia.;Provide education about proper nutrition, including hydration, and aerobic/resistive exercise prescription along with prescribed medications to achieve blood glucose in normal ranges: Fasting glucose 65-99 mg/dL    Expected Outcomes Short Term: Participant verbalizes understanding of the signs/symptoms and immediate care of hyper/hypoglycemia, proper foot care and importance of medication, aerobic/resistive exercise and nutrition plan for blood glucose control.;Long Term: Attainment of HbA1C < 7%.    Heart Failure Yes    Intervention Provide a combined exercise and nutrition program that is supplemented with education, support and counseling about  heart failure. Directed toward relieving symptoms such as shortness of breath, decreased exercise tolerance, and extremity edema.    Expected Outcomes Improve functional capacity of life;Short term: Attendance in program 2-3 days a week with increased exercise capacity. Reported lower sodium intake. Reported increased fruit and vegetable intake. Reports medication compliance.;Short term: Daily weights obtained and reported for increase. Utilizing diuretic protocols set by physician.;Long term: Adoption of self-care skills and reduction of barriers for early signs and symptoms recognition and intervention leading to self-care maintenance.    Hypertension Yes    Intervention Provide education on lifestyle modifcations including regular physical activity/exercise, weight management, moderate sodium restriction and increased consumption of fresh fruit, vegetables, and low fat dairy, alcohol moderation, and smoking cessation.;Monitor prescription use compliance.    Expected Outcomes Short Term: Continued assessment and intervention until BP is < 140/33mm HG in hypertensive participants. <  130/68mm HG in hypertensive participants with diabetes, heart failure or chronic kidney disease.;Long Term: Maintenance of blood pressure at goal levels.    Lipids Yes    Intervention Provide education and support for participant on nutrition & aerobic/resistive exercise along with prescribed medications to achieve LDL 70mg , HDL >40mg .    Expected Outcomes Short Term: Participant states understanding of desired cholesterol values and is compliant with medications prescribed. Participant is following exercise prescription and nutrition guidelines.;Long Term: Cholesterol controlled with medications as prescribed, with individualized exercise RX and with personalized nutrition plan. Value goals: LDL < 70mg , HDL > 40 mg.             Core Components/Risk Factors/Patient Goals Review:    Core Components/Risk Factors/Patient Goals at Discharge (Final Review):    ITP Comments:  ITP Comments     Row Name 09/21/22 0835 09/28/22 0953         ITP Comments Dr. Armanda Magic medical dierctor. Introduction to pritikin education/ intensive cardiac rehab. Initial orientation packet reviewed with patient. 30 Day ITP. No exercise on 09/28/22 due to elevated CBG. Exercise on hold until seen at the [place] [facility].               Comments: See ITP comments.Thayer Headings RN BSN

## 2022-09-28 NOTE — Progress Notes (Signed)
Incomplete Session Note  Patient Details  Name: Tristan Figueroa MRN: [id number] Date of Birth: 1956-03-28 Referring Provider:   Flowsheet Row INTENSIVE CARDIAC REHAB ORIENT from 09/21/2022 in [facility]  Referring Provider Tristan Muss,  MD       [name] did not complete his rehab session.  CBG 315. No exercise per protocol. Blood pressure 152/80 heart rate 82. Oxygen saturation 99% on room air. The [facility] was called and notified. Medications reviewed. Haldon said that he started taking his Entresto last Wednesday as prescribed. Spoke with the nurse triage who spoke with the patient. Asymptomatic. Will hold exercise today.It was recommended by the nurse triage for [name] to be seen in the next 3 days. Patient states understanding. Will hold on starting exercise until CBG's are better controlled. Appointment obtained for [name] to seen at 1 pm at the [place].Thayer Headings RN BSN

## 2022-09-30 ENCOUNTER — Encounter (HOSPITAL_COMMUNITY): Payer: No Typology Code available for payment source

## 2022-09-30 ENCOUNTER — Ambulatory Visit (HOSPITAL_COMMUNITY): Payer: No Typology Code available for payment source

## 2022-10-02 ENCOUNTER — Ambulatory Visit (HOSPITAL_COMMUNITY): Payer: No Typology Code available for payment source

## 2022-10-02 ENCOUNTER — Encounter (HOSPITAL_COMMUNITY): Payer: No Typology Code available for payment source

## 2022-10-05 ENCOUNTER — Ambulatory Visit (HOSPITAL_COMMUNITY): Payer: No Typology Code available for payment source

## 2022-10-05 ENCOUNTER — Encounter (HOSPITAL_COMMUNITY): Payer: No Typology Code available for payment source

## 2022-10-07 ENCOUNTER — Encounter (HOSPITAL_COMMUNITY): Payer: No Typology Code available for payment source

## 2022-10-07 ENCOUNTER — Ambulatory Visit (HOSPITAL_COMMUNITY): Payer: No Typology Code available for payment source

## 2022-10-09 ENCOUNTER — Ambulatory Visit (HOSPITAL_COMMUNITY): Payer: No Typology Code available for payment source

## 2022-10-09 ENCOUNTER — Encounter (HOSPITAL_COMMUNITY): Payer: No Typology Code available for payment source

## 2022-10-12 ENCOUNTER — Encounter (HOSPITAL_COMMUNITY): Payer: No Typology Code available for payment source

## 2022-10-12 ENCOUNTER — Ambulatory Visit (HOSPITAL_COMMUNITY): Payer: No Typology Code available for payment source

## 2022-10-14 ENCOUNTER — Ambulatory Visit (HOSPITAL_COMMUNITY): Payer: No Typology Code available for payment source

## 2022-10-14 ENCOUNTER — Encounter (HOSPITAL_COMMUNITY): Payer: No Typology Code available for payment source

## 2022-10-16 ENCOUNTER — Ambulatory Visit (HOSPITAL_COMMUNITY): Payer: No Typology Code available for payment source

## 2022-10-16 ENCOUNTER — Telehealth (HOSPITAL_COMMUNITY): Payer: Self-pay

## 2022-10-16 ENCOUNTER — Encounter (HOSPITAL_COMMUNITY): Payer: No Typology Code available for payment source

## 2022-10-16 NOTE — Telephone Encounter (Signed)
CARDIAC REHAB PHASE 2   Left VM for patient, no CRP2 today due to unexpected down time.   Harrie Jeans ACSM-CEP 10/16/2022 7:41 AM

## 2022-10-19 ENCOUNTER — Ambulatory Visit (HOSPITAL_COMMUNITY): Payer: No Typology Code available for payment source

## 2022-10-19 ENCOUNTER — Encounter (HOSPITAL_COMMUNITY): Payer: No Typology Code available for payment source

## 2022-10-21 ENCOUNTER — Ambulatory Visit (HOSPITAL_COMMUNITY): Payer: No Typology Code available for payment source

## 2022-10-21 ENCOUNTER — Encounter (HOSPITAL_COMMUNITY): Payer: No Typology Code available for payment source

## 2022-10-23 ENCOUNTER — Encounter (HOSPITAL_COMMUNITY): Payer: No Typology Code available for payment source

## 2022-10-23 ENCOUNTER — Ambulatory Visit (HOSPITAL_COMMUNITY): Payer: No Typology Code available for payment source

## 2022-10-23 ENCOUNTER — Encounter (HOSPITAL_COMMUNITY): Payer: Self-pay | Admitting: *Deleted

## 2022-10-23 NOTE — Progress Notes (Signed)
Ula attended cardiac orientation on 09/21/22. Eules did not begin exercise as his CBg was elevated on 09/28/22. [facility]. Taishawn did not return to cardiac rehab and has been discharged due to non attendance.Thayer Headings RN BSN

## 2022-10-26 ENCOUNTER — Encounter (HOSPITAL_COMMUNITY): Payer: No Typology Code available for payment source

## 2022-10-26 ENCOUNTER — Ambulatory Visit (HOSPITAL_COMMUNITY): Payer: No Typology Code available for payment source

## 2022-10-28 ENCOUNTER — Ambulatory Visit (HOSPITAL_COMMUNITY): Payer: No Typology Code available for payment source

## 2022-10-28 ENCOUNTER — Encounter (HOSPITAL_COMMUNITY): Payer: No Typology Code available for payment source

## 2022-10-30 ENCOUNTER — Encounter (HOSPITAL_COMMUNITY): Payer: No Typology Code available for payment source

## 2022-10-30 ENCOUNTER — Ambulatory Visit (HOSPITAL_COMMUNITY): Payer: No Typology Code available for payment source

## 2022-11-02 ENCOUNTER — Ambulatory Visit (HOSPITAL_COMMUNITY): Payer: No Typology Code available for payment source

## 2022-11-02 ENCOUNTER — Encounter (HOSPITAL_COMMUNITY): Payer: No Typology Code available for payment source

## 2022-11-04 ENCOUNTER — Encounter (HOSPITAL_COMMUNITY): Payer: No Typology Code available for payment source

## 2022-11-04 ENCOUNTER — Ambulatory Visit (HOSPITAL_COMMUNITY): Payer: No Typology Code available for payment source

## 2022-11-06 ENCOUNTER — Ambulatory Visit (HOSPITAL_COMMUNITY): Payer: No Typology Code available for payment source

## 2022-11-06 ENCOUNTER — Encounter (HOSPITAL_COMMUNITY): Payer: No Typology Code available for payment source

## 2022-11-09 ENCOUNTER — Ambulatory Visit (HOSPITAL_COMMUNITY): Payer: No Typology Code available for payment source

## 2022-11-09 ENCOUNTER — Encounter (HOSPITAL_COMMUNITY): Payer: No Typology Code available for payment source

## 2022-11-11 ENCOUNTER — Ambulatory Visit (HOSPITAL_COMMUNITY): Payer: No Typology Code available for payment source

## 2022-11-11 ENCOUNTER — Encounter (HOSPITAL_COMMUNITY): Payer: No Typology Code available for payment source

## 2022-11-13 ENCOUNTER — Encounter (HOSPITAL_COMMUNITY): Payer: No Typology Code available for payment source

## 2022-11-13 ENCOUNTER — Ambulatory Visit (HOSPITAL_COMMUNITY): Payer: No Typology Code available for payment source

## 2022-11-16 ENCOUNTER — Ambulatory Visit (HOSPITAL_COMMUNITY): Payer: No Typology Code available for payment source

## 2022-11-16 ENCOUNTER — Encounter (HOSPITAL_COMMUNITY): Payer: No Typology Code available for payment source

## 2022-11-18 ENCOUNTER — Encounter (HOSPITAL_COMMUNITY): Payer: No Typology Code available for payment source

## 2022-11-18 ENCOUNTER — Ambulatory Visit (HOSPITAL_COMMUNITY): Payer: No Typology Code available for payment source

## 2022-11-20 ENCOUNTER — Encounter (HOSPITAL_COMMUNITY): Payer: No Typology Code available for payment source

## 2022-11-20 ENCOUNTER — Ambulatory Visit (HOSPITAL_COMMUNITY): Payer: No Typology Code available for payment source

## 2022-11-23 ENCOUNTER — Ambulatory Visit (HOSPITAL_COMMUNITY): Payer: No Typology Code available for payment source

## 2022-11-23 ENCOUNTER — Encounter (HOSPITAL_COMMUNITY): Payer: No Typology Code available for payment source

## 2022-11-25 ENCOUNTER — Ambulatory Visit (HOSPITAL_COMMUNITY): Payer: No Typology Code available for payment source

## 2022-11-25 ENCOUNTER — Encounter (HOSPITAL_COMMUNITY): Payer: No Typology Code available for payment source

## 2022-11-27 ENCOUNTER — Encounter (HOSPITAL_COMMUNITY): Payer: No Typology Code available for payment source

## 2022-11-27 ENCOUNTER — Ambulatory Visit (HOSPITAL_COMMUNITY): Payer: No Typology Code available for payment source

## 2022-12-02 ENCOUNTER — Encounter (HOSPITAL_COMMUNITY): Payer: No Typology Code available for payment source

## 2022-12-04 ENCOUNTER — Encounter (HOSPITAL_COMMUNITY): Payer: No Typology Code available for payment source

## 2023-03-10 ENCOUNTER — Other Ambulatory Visit (HOSPITAL_COMMUNITY): Payer: Self-pay
